# Patient Record
Sex: Female | Born: 1955 | Race: White | Hispanic: No | Marital: Married | Smoking: Never smoker
Health system: Southern US, Community
[De-identification: ages and names within clinical notes are randomized; demographics above are authoritative.]

## PROBLEM LIST (undated history)

## (undated) DIAGNOSIS — H8109 Meniere's disease, unspecified ear: Secondary | ICD-10-CM

## (undated) DIAGNOSIS — I10 Essential (primary) hypertension: Secondary | ICD-10-CM

## (undated) DIAGNOSIS — F419 Anxiety disorder, unspecified: Secondary | ICD-10-CM

## (undated) HISTORY — PX: OTHER SURGICAL HISTORY: SHX169

## (undated) HISTORY — PX: CHOLECYSTECTOMY: SHX55

---

## 1998-09-25 ENCOUNTER — Other Ambulatory Visit: Admission: RE | Admit: 1998-09-25 | Discharge: 1998-09-25 | Payer: Self-pay | Admitting: Gynecology

## 1999-11-27 ENCOUNTER — Other Ambulatory Visit: Admission: RE | Admit: 1999-11-27 | Discharge: 1999-11-27 | Payer: Self-pay | Admitting: Gynecology

## 2000-11-11 ENCOUNTER — Encounter: Payer: Self-pay | Admitting: Family Medicine

## 2000-11-11 ENCOUNTER — Ambulatory Visit (HOSPITAL_COMMUNITY): Admission: RE | Admit: 2000-11-11 | Discharge: 2000-11-11 | Payer: Self-pay | Admitting: Family Medicine

## 2004-01-24 ENCOUNTER — Ambulatory Visit (HOSPITAL_COMMUNITY): Admission: RE | Admit: 2004-01-24 | Discharge: 2004-01-24 | Payer: Self-pay | Admitting: Family Medicine

## 2004-10-30 ENCOUNTER — Ambulatory Visit (HOSPITAL_COMMUNITY): Admission: RE | Admit: 2004-10-30 | Discharge: 2004-10-30 | Payer: Self-pay | Admitting: Family Medicine

## 2005-01-15 ENCOUNTER — Ambulatory Visit: Payer: Self-pay | Admitting: Internal Medicine

## 2005-01-23 ENCOUNTER — Ambulatory Visit: Payer: Self-pay | Admitting: Internal Medicine

## 2006-01-29 ENCOUNTER — Ambulatory Visit (HOSPITAL_COMMUNITY): Admission: RE | Admit: 2006-01-29 | Discharge: 2006-01-29 | Payer: Self-pay | Admitting: Family Medicine

## 2006-02-06 ENCOUNTER — Ambulatory Visit (HOSPITAL_COMMUNITY): Admission: RE | Admit: 2006-02-06 | Discharge: 2006-02-06 | Payer: Self-pay | Admitting: Family Medicine

## 2006-02-27 ENCOUNTER — Ambulatory Visit: Payer: Self-pay | Admitting: Internal Medicine

## 2006-03-10 ENCOUNTER — Ambulatory Visit: Payer: Self-pay | Admitting: Internal Medicine

## 2006-03-10 ENCOUNTER — Ambulatory Visit (HOSPITAL_COMMUNITY): Admission: RE | Admit: 2006-03-10 | Discharge: 2006-03-10 | Payer: Self-pay | Admitting: Internal Medicine

## 2006-03-10 ENCOUNTER — Encounter (INDEPENDENT_AMBULATORY_CARE_PROVIDER_SITE_OTHER): Payer: Self-pay | Admitting: *Deleted

## 2009-04-04 ENCOUNTER — Ambulatory Visit (HOSPITAL_COMMUNITY): Admission: RE | Admit: 2009-04-04 | Discharge: 2009-04-04 | Payer: Self-pay | Admitting: Family Medicine

## 2009-08-03 ENCOUNTER — Ambulatory Visit (HOSPITAL_COMMUNITY): Admission: RE | Admit: 2009-08-03 | Discharge: 2009-08-03 | Payer: Self-pay | Admitting: Family Medicine

## 2009-08-12 ENCOUNTER — Encounter: Admission: RE | Admit: 2009-08-12 | Discharge: 2009-08-12 | Payer: Self-pay | Admitting: Orthopedic Surgery

## 2009-08-15 ENCOUNTER — Encounter: Admission: RE | Admit: 2009-08-15 | Discharge: 2009-08-15 | Payer: Self-pay | Admitting: Orthopedic Surgery

## 2009-09-18 ENCOUNTER — Encounter: Admission: RE | Admit: 2009-09-18 | Discharge: 2009-09-18 | Payer: Self-pay | Admitting: Orthopedic Surgery

## 2010-03-18 ENCOUNTER — Encounter: Payer: Self-pay | Admitting: Family Medicine

## 2010-03-18 ENCOUNTER — Encounter: Payer: Self-pay | Admitting: Orthopedic Surgery

## 2010-05-28 ENCOUNTER — Other Ambulatory Visit (HOSPITAL_COMMUNITY): Payer: Self-pay | Admitting: Family Medicine

## 2010-05-28 DIAGNOSIS — Z01419 Encounter for gynecological examination (general) (routine) without abnormal findings: Secondary | ICD-10-CM

## 2010-05-28 DIAGNOSIS — I4510 Unspecified right bundle-branch block: Secondary | ICD-10-CM

## 2010-05-28 DIAGNOSIS — D259 Leiomyoma of uterus, unspecified: Secondary | ICD-10-CM

## 2010-05-28 DIAGNOSIS — Z780 Asymptomatic menopausal state: Secondary | ICD-10-CM

## 2010-05-31 ENCOUNTER — Ambulatory Visit (HOSPITAL_COMMUNITY)
Admission: RE | Admit: 2010-05-31 | Discharge: 2010-05-31 | Disposition: A | Discharge status: Home / Self Care | Payer: PRIVATE HEALTH INSURANCE | Source: Ambulatory Visit | Attending: Family Medicine | Admitting: Family Medicine

## 2010-05-31 ENCOUNTER — Encounter (HOSPITAL_COMMUNITY): Payer: Self-pay

## 2010-05-31 DIAGNOSIS — Z780 Asymptomatic menopausal state: Secondary | ICD-10-CM

## 2010-05-31 DIAGNOSIS — Z01419 Encounter for gynecological examination (general) (routine) without abnormal findings: Secondary | ICD-10-CM

## 2010-05-31 DIAGNOSIS — I4510 Unspecified right bundle-branch block: Secondary | ICD-10-CM

## 2010-05-31 DIAGNOSIS — M818 Other osteoporosis without current pathological fracture: Secondary | ICD-10-CM | POA: Insufficient documentation

## 2010-05-31 DIAGNOSIS — D259 Leiomyoma of uterus, unspecified: Secondary | ICD-10-CM

## 2010-07-13 NOTE — Op Note (Signed)
[name], [name]                 ACCOUNT NO.:  [id number]   MEDICAL RECORD NO.:  [id number]          PATIENT TYPE:  AMB   LOCATION:  DAY                           FACILITY:  [facility]   PHYSICIAN:  Virginia Jones, M.D.    DATE OF BIRTH:  1955/03/28   DATE OF PROCEDURE:  03/10/2006  DATE OF DISCHARGE:                               OPERATIVE REPORT   PROCEDURE:  Colonoscopy with polypectomy.   INDICATIONS:  Virginia Jones is 55 year old Caucasian female who is undergoing  average risk screening colonoscopy.  Procedures were reviewed with the  patient and informed consent was obtained.   MEDS FOR CONSCIOUS SEDATION:  Demerol 50 mg IV, Versed 6 mg IV.   FINDINGS:  Procedure performed in endoscopy suite.  The patient's vital  signs and O2 sat were monitored during procedure and remained stable.  The patient was placed left lateral recumbent position.  Rectal  examination performed.  No abnormality noted external or digital exam.  Pentax videoscope was placed rectum and advanced under vision into  sigmoid colon and beyond.  Preparation was satisfactory.  Scope was  passed into cecum which was identified by ileocecal valve and  appendiceal orifice.  Pictures taken for the record.  There was a 2 x 3  mm polyp at cecum which was easily ablated via cold biopsy.  There was  another small polyp at proximal sigmoid colon which was ablated via cold  biopsy.  At [facility] colon there was a polyp on a short stalk about 8-  9 mm in maximal diameter.  This was snared and possibly broken pieces as  was removed.  There was a little residual polyp at polypectomy site  which was easily coagulated using snare tip.  Polypectomy was felt to be  complete.  Finally there was another small polyp at distal sigmoid  colon.  This was about 4 mm and this was easily ablated using snare tip.  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  Rectal mucosa was normal.   Scope  was retroflexed to examine anorectal junction and there was single small  anal papilla.  Endoscope was straightened and withdrawn.  The patient  tolerated the procedure well.   FINAL DIAGNOSIS:  8-9 mm polyp snared from mid sigmoid colon.  Two  polyps ablated via cold biopsy.  One from cecum, another one from  proximal sigmoid colon.  Fourth small polyp at distal sigmoid colon was  coagulated using snare tip.  Small anal papilla.   RECOMMENDATIONS:  Standard instructions given.  I will be contacting  patient results of biopsy and further recommendations regarding timing  of her next colonoscopy.      Virginia Jones, M.D.  Electronically Signed     NR/MEDQ  D:  03/10/2006  T:  03/11/2006  Job:  [id number]   cc:   Corrie Mckusick, M.D.  Fax: [phone]

## 2010-07-13 NOTE — Consult Note (Signed)
NAMEMARINA, BOERNER                 ACCOUNT NO.:  [id number]   MEDICAL RECORD NO.:  [id number]          PATIENT TYPE:  AMB   LOCATION:                                FACILITY:  [facility]   PHYSICIAN:  Lionel December, M.D.    DATE OF BIRTH:  24-Sep-1955   DATE OF CONSULTATION:  02/27/2006  DATE OF DISCHARGE:                                 CONSULTATION   REASON FOR CONSULTATION:  The patient interested in colonoscopy.   Still with intermittent chest pain.   HISTORY OF PRESENT ILLNESS:  Virginia Jones is a 55 year old Caucasian female who  is referred through courtesy of Dr. Phillips Odor for colonoscopy.  The  patient denies melena, rectal bleeding.  Her bowels have always been  irregular.  She may go daily, every other day or every third day.  She  denies abdominal pain, heartburn or dysphagia.  She remains with  intermittent retrosternal pain.  She was last seen by me in November  2006 for this pain.  It was felt to be a musculoskeletal pain.  At that  time, she was treated empirically with AcipHex without improvement.  She  also had a negative Cardiolite scan.  She also did not improve with  NSAIDs.  She states this pain has gradually improved.  Now it is  intermittent and less intense and mostly experienced when she changes  position or turns in bed.  She wonders if she should also have an EGD at  the time of colonoscopy.  She has very good appetite.  She has gained 18  pounds in the last 14 months.  She states she is trying to walk and  exercise daily, but apparently not doing enough.   She is on Zoloft 25 mg every other day, Protonix 40 mg q.a.m., Advil 4  mg daily p.r.n., HCTZ 25 mg daily.   PAST MEDICAL HISTORY:  1. History of depression diagnosed in 1996 secondary to loss of mother      controlled with therapy.  2. Meniere's disease.  3. She had shunting to her left ear in July 2006 with good response.  4. She had cholecystectomy 1990 for gallstones.  5. She is on PPI for presumed GERD,  although she has not had typical      symptoms.   ALLERGIES:  NK.   FAMILY HISTORY:  Mother had emphysema and died at age 51.  Father is 60  years old in good health.  She has a sister and two brothers also in  good health.   SOCIAL HISTORY:  She is married but does not have any children.  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  She has been working in  [employer] for over 7 years.  She has never smoked cigarettes and  drinks alcohol socially but not every day.   OBJECTIVE:  Pleasant, well-developed, well-nourished Caucasian female  who is in no acute distress.  She is 160 pounds.  She is 5 feet 3 inches  tall.  Pulse 62 per minute, blood pressure 122/86, temperature is 97.7.  Conjunctivae  is pink.  Sclerae is nonicteric.  Oropharyngeal mucosa is  normal.  No neck masses or thyromegaly noted.  Cardiac exam with regular  rhythm.  Normal S1-S2.  No murmur or gallop noted.  Lungs are clear to  auscultation.  Abdomen is full.  Bowel sounds are normal.  On palpation  it is soft and nontender.  No organomegaly or masses noted.  Rectal  examination deferred.  No peripheral edema or clubbing noted.   ASSESSMENT:  1. Virginia Jones is a 55 year old Caucasian female with average risk for      colorectal carcinoma.  I agree with the idea of screening      colonoscopy.  2. History of chest pain felt to be atypical:  I am not sure that      proton pump inhibitor has helped.  She does not have typical      symptoms of gastroesophageal reflux disease.  At this point, I am      not sure whether an EGD would help.  Would stop proton pump      inhibitor and see if she has new onset of symptoms in which case      EGD would be offered   RECOMMENDATIONS:  Screening colonoscopy to be performed at [facility] in near  future.  I have reviewed the procedures with the patient including  conscious sedation.  She is agreeable.   I would like for her to stop Protonix and see if she has a relapse of  her  symptoms in which case would consider diagnostic EGD.   We appreciate the opportunity to participate in care of this nice lady.   ADDENDUM:  Labs reviewed done January 29, 2006:  CBC:  Comprehensive  chemistry panel normal.  Total cholesterol was 166, AST of 95,  triglyceride 58 and her LDL was 59.      Lionel December, M.D.  Electronically Signed     NR/MEDQ  D:  02/27/2006  T:  02/27/2006  Job:  [id number]

## 2011-02-28 ENCOUNTER — Encounter (INDEPENDENT_AMBULATORY_CARE_PROVIDER_SITE_OTHER): Payer: Self-pay | Admitting: *Deleted

## 2011-08-06 ENCOUNTER — Telehealth (INDEPENDENT_AMBULATORY_CARE_PROVIDER_SITE_OTHER): Payer: Self-pay | Admitting: *Deleted

## 2011-08-06 ENCOUNTER — Other Ambulatory Visit (INDEPENDENT_AMBULATORY_CARE_PROVIDER_SITE_OTHER): Payer: Self-pay | Admitting: *Deleted

## 2011-08-06 DIAGNOSIS — Z1211 Encounter for screening for malignant neoplasm of colon: Secondary | ICD-10-CM

## 2011-08-06 DIAGNOSIS — Z86010 Personal history of colonic polyps: Secondary | ICD-10-CM

## 2011-08-06 MED ORDER — PEG-KCL-NACL-NASULF-NA ASC-C 100 G PO SOLR: 1.0000 | Freq: Once | ORAL | Status: DC

## 2011-08-06 NOTE — Telephone Encounter (Signed)
Patient needs movi prep 

## 2011-09-16 ENCOUNTER — Encounter (HOSPITAL_COMMUNITY): Payer: Self-pay | Admitting: Pharmacy Technician

## 2011-09-16 ENCOUNTER — Telehealth (INDEPENDENT_AMBULATORY_CARE_PROVIDER_SITE_OTHER): Payer: Self-pay | Admitting: *Deleted

## 2011-09-16 NOTE — Telephone Encounter (Signed)
agree

## 2011-09-16 NOTE — Telephone Encounter (Signed)
PCP/Requesting MD: golding  Name & DOB: darrell leonhardt 01/14/1956     Procedure: tcs  Reason/Indication:  Hx polyps  Has patient had this procedure before?  yes  If so, when, by whom and where?  1/08  Is there a family history of colon cancer?  no  Who?  What age when diagnosed?    Is patient diabetic?   no      Does patient have prosthetic heart valve?  no  Do you have a pacemaker?  no  Has patient had joint replacement within last 12 months?  no  Is patient on Coumadin, Plavix and/or Aspirin? no  Medications: hctz 25 mg daily, zoloft 100 mg daily  Allergies: nkda  Medication Adjustment:   Procedure date & time: 09/26/11 at 1030

## 2011-09-23 ENCOUNTER — Telehealth (INDEPENDENT_AMBULATORY_CARE_PROVIDER_SITE_OTHER): Payer: Self-pay | Admitting: *Deleted

## 2011-09-23 DIAGNOSIS — Z1211 Encounter for screening for malignant neoplasm of colon: Secondary | ICD-10-CM

## 2011-09-23 MED ORDER — PEG-KCL-NACL-NASULF-NA ASC-C 100 G PO SOLR: 1.0000 | Freq: Once | ORAL | Status: DC

## 2011-09-23 NOTE — Telephone Encounter (Signed)
Patient states pharmacy didn't have, please resend 

## 2011-09-26 ENCOUNTER — Encounter (HOSPITAL_COMMUNITY)
Admission: RE | Disposition: A | Discharge status: Home / Self Care | Payer: Self-pay | Source: Ambulatory Visit | Attending: Internal Medicine

## 2011-09-26 ENCOUNTER — Encounter (HOSPITAL_COMMUNITY): Payer: Self-pay | Admitting: *Deleted

## 2011-09-26 ENCOUNTER — Ambulatory Visit (HOSPITAL_COMMUNITY)
Admission: RE | Admit: 2011-09-26 | Discharge: 2011-09-26 | Disposition: A | Discharge status: Home / Self Care | Payer: PRIVATE HEALTH INSURANCE | Source: Ambulatory Visit | Attending: Internal Medicine | Admitting: Internal Medicine

## 2011-09-26 DIAGNOSIS — D126 Benign neoplasm of colon, unspecified: Secondary | ICD-10-CM | POA: Insufficient documentation

## 2011-09-26 DIAGNOSIS — Z860100 Personal history of colon polyps, unspecified: Secondary | ICD-10-CM | POA: Insufficient documentation

## 2011-09-26 DIAGNOSIS — Z09 Encounter for follow-up examination after completed treatment for conditions other than malignant neoplasm: Secondary | ICD-10-CM | POA: Insufficient documentation

## 2011-09-26 DIAGNOSIS — K6389 Other specified diseases of intestine: Secondary | ICD-10-CM

## 2011-09-26 DIAGNOSIS — K644 Residual hemorrhoidal skin tags: Secondary | ICD-10-CM

## 2011-09-26 HISTORY — DX: Anxiety disorder, unspecified: F41.9

## 2011-09-26 HISTORY — PX: COLONOSCOPY: SHX5424

## 2011-09-26 SURGERY — COLONOSCOPY
Anesthesia: Moderate Sedation

## 2011-09-26 MED ORDER — MEPERIDINE HCL 50 MG/ML IJ SOLN
INTRAMUSCULAR | Status: AC
  Filled 2011-09-26: qty 1

## 2011-09-26 MED ORDER — SODIUM CHLORIDE 0.45 % IV SOLN
Freq: Once | INTRAVENOUS | Status: AC
  Administered 2011-09-26: 10:00:00 via INTRAVENOUS

## 2011-09-26 MED ORDER — MIDAZOLAM HCL 5 MG/5ML IJ SOLN
INTRAMUSCULAR | Status: DC | PRN
  Administered 2011-09-26 (×4): 2 mg via INTRAVENOUS

## 2011-09-26 MED ORDER — STERILE WATER FOR IRRIGATION IR SOLN
Status: DC | PRN
  Administered 2011-09-26: 11:00:00

## 2011-09-26 MED ORDER — MEPERIDINE HCL 50 MG/ML IJ SOLN
INTRAMUSCULAR | Status: DC | PRN
  Administered 2011-09-26 (×2): 25 mg via INTRAVENOUS

## 2011-09-26 MED ORDER — MIDAZOLAM HCL 5 MG/5ML IJ SOLN
INTRAMUSCULAR | Status: AC
  Filled 2011-09-26: qty 10

## 2011-09-26 NOTE — H&P (Signed)
Virginia Jones is an 56 y.o. female.   Chief Complaint: Patient is here for colonoscopy. HPI: Patient is 55 year old Caucasian female with history of colonic polyps. She had 3 polyps removed over 5 years ago and these were tubular adenomas. Fourth polyp was coagulated. She denies abdominal pain change in her bowel habits or rectal bleeding. Family history significant for colonic polyps in her father no history of colorectal carcinoma.  Past Medical History  Diagnosis Date  . Anxiety     Past Surgical History  Procedure Date  . Cholecystectomy   . Ear shunt left     History reviewed. No pertinent family history. Social History:  reports that she has never smoked. She does not have any smokeless tobacco history on file. She reports that she drinks alcohol. She reports that she does not use illicit drugs.  Allergies: No Known Allergies  Medications Prior to Admission  Medication Sig Dispense Refill  . Calcium Carbonate-Vit D-Min (CALCIUM-VITAMIN D-MINERALS) 600-400 MG-UNIT CHEW Chew 1 tablet by mouth every morning.      . hydrochlorothiazide (HYDRODIURIL) 25 MG tablet Take 25 mg by mouth every morning.      . peg 3350 powder (MOVIPREP) 100 G SOLR Take 1 kit (100 g total) by mouth once.  1 kit  0  . sertraline (ZOLOFT) 100 MG tablet Take 100 mg by mouth every morning.        No results found for this or any previous visit (from the past 48 hour(s)). No results found.  ROS  Blood pressure 133/84, pulse 68, temperature 98.2 F (36.8 C), temperature source Oral, resp. rate 16, height 5\' 3"  (1.6 m), weight 182 lb (82.555 kg), SpO2 94.00%. Physical Exam  Constitutional: She appears well-developed and well-nourished.  HENT:  Mouth/Throat: Oropharynx is clear and moist.  Eyes: Conjunctivae are normal. No scleral icterus.  Neck: No thyromegaly present.  Cardiovascular: Normal rate, regular rhythm and normal heart sounds.   No murmur heard. Respiratory: Effort normal.  GI: Soft. She  exhibits no distension and no mass. There is no tenderness.  Musculoskeletal: She exhibits no edema.  Lymphadenopathy:    She has no cervical adenopathy.  Neurological: She is alert.  Skin: Skin is warm and dry.     Assessment/Plan History of colonic polyps. Surveillance colonoscopy.  Alhaji Mcneal U 09/26/2011, 10:43 AM

## 2011-09-26 NOTE — Op Note (Signed)
COLONOSCOPY PROCEDURE REPORT  PATIENT:  Virginia Jones  MR#:  [id number] Birthdate:  12-11-1955, 56 y.o., female Endoscopist:  Dr. Malissa Hippo, MD Referred By:  Dr. Colette Ribas, MD Procedure Date: 09/26/2011  Procedure:   Colonoscopy with snare polypectomy.  Indications:  Patient is 56 year old Caucasian female with history of colonic adenomas and here for surveillance colonoscopy. Her last exam was in January 2008 with removal of 3 adenomas. Fourth polyp was coagulated.  Informed Consent:  The procedure and risks were reviewed with the patient and informed consent was obtained.  Medications:  Demerol 50 mg IV Versed 8 mg IV  Description of procedure:  After a digital rectal exam was performed, that colonoscope was advanced from the anus through the rectum and colon to the area of the cecum, ileocecal valve and appendiceal orifice. The cecum was deeply intubated. These structures were well-seen and photographed for the record. From the level of the cecum and ileocecal valve, the scope was slowly and cautiously withdrawn. The mucosal surfaces were carefully surveyed utilizing scope tip to flexion to facilitate fold flattening as needed. The scope was pulled down into the rectum where a thorough exam including retroflexion was performed.  Findings:   Prep satisfactory. Mucosa of the cecum had to be washed free of stool to see landmarks. Small polyp ablated via cold biopsy from ascending colon. Broad-based 8-10 mm polyp snared from proximal sigmoid colon. Part of the polyp was coagulated during this process. Normal rectal mucosa. Small hemorrhoids below the dentate line and 2 anal papillae.  Therapeutic/Diagnostic Maneuvers Performed:  See above  Complications:  None  Cecal Withdrawal Time:  21 minutes  Impression:  Examination performed to cecum. Small polyp ablated via cold biopsy from ascending colon. 8-10 mm broad based polyp snared from proximal sigmoid colon. Small  external hemorrhoids and two anal papillae  Recommendations:  Standard instructions given. I will contact patient with biopsy results and further recommendations.  REHMAN,NAJEEB U  09/26/2011 11:26 AM  CC: Dr. Colette Ribas, MD & Dr. Bonnetta Barry ref. provider found

## 2011-10-01 ENCOUNTER — Encounter (HOSPITAL_COMMUNITY): Payer: Self-pay | Admitting: Internal Medicine

## 2011-10-02 ENCOUNTER — Encounter (INDEPENDENT_AMBULATORY_CARE_PROVIDER_SITE_OTHER): Payer: Self-pay | Admitting: *Deleted

## 2016-03-26 ENCOUNTER — Encounter: Payer: Self-pay | Admitting: Internal Medicine

## 2016-09-23 ENCOUNTER — Encounter (INDEPENDENT_AMBULATORY_CARE_PROVIDER_SITE_OTHER): Payer: Self-pay | Admitting: *Deleted

## 2017-06-17 DIAGNOSIS — Z1389 Encounter for screening for other disorder: Secondary | ICD-10-CM | POA: Diagnosis not present

## 2017-06-17 DIAGNOSIS — M5430 Sciatica, unspecified side: Secondary | ICD-10-CM | POA: Diagnosis not present

## 2017-06-17 DIAGNOSIS — S300XXA Contusion of lower back and pelvis, initial encounter: Secondary | ICD-10-CM | POA: Diagnosis not present

## 2017-08-08 DIAGNOSIS — I10 Essential (primary) hypertension: Secondary | ICD-10-CM | POA: Diagnosis not present

## 2017-08-08 DIAGNOSIS — R739 Hyperglycemia, unspecified: Secondary | ICD-10-CM | POA: Diagnosis not present

## 2017-08-08 DIAGNOSIS — Z1389 Encounter for screening for other disorder: Secondary | ICD-10-CM | POA: Diagnosis not present

## 2017-08-08 DIAGNOSIS — Z23 Encounter for immunization: Secondary | ICD-10-CM | POA: Diagnosis not present

## 2017-08-08 DIAGNOSIS — R001 Bradycardia, unspecified: Secondary | ICD-10-CM | POA: Diagnosis not present

## 2017-08-08 DIAGNOSIS — E6609 Other obesity due to excess calories: Secondary | ICD-10-CM | POA: Diagnosis not present

## 2017-08-08 DIAGNOSIS — M5431 Sciatica, right side: Secondary | ICD-10-CM | POA: Diagnosis not present

## 2017-08-08 DIAGNOSIS — Z0001 Encounter for general adult medical examination with abnormal findings: Secondary | ICD-10-CM | POA: Diagnosis not present

## 2017-12-15 DIAGNOSIS — L570 Actinic keratosis: Secondary | ICD-10-CM | POA: Diagnosis not present

## 2017-12-15 DIAGNOSIS — L821 Other seborrheic keratosis: Secondary | ICD-10-CM | POA: Diagnosis not present

## 2017-12-15 DIAGNOSIS — L814 Other melanin hyperpigmentation: Secondary | ICD-10-CM | POA: Diagnosis not present

## 2018-03-11 DIAGNOSIS — Z1231 Encounter for screening mammogram for malignant neoplasm of breast: Secondary | ICD-10-CM | POA: Diagnosis not present

## 2018-03-16 DIAGNOSIS — E6609 Other obesity due to excess calories: Secondary | ICD-10-CM | POA: Diagnosis not present

## 2018-03-16 DIAGNOSIS — Z6833 Body mass index (BMI) 33.0-33.9, adult: Secondary | ICD-10-CM | POA: Diagnosis not present

## 2018-03-16 DIAGNOSIS — Z1389 Encounter for screening for other disorder: Secondary | ICD-10-CM | POA: Diagnosis not present

## 2018-04-30 DIAGNOSIS — Z23 Encounter for immunization: Secondary | ICD-10-CM | POA: Diagnosis not present

## 2018-04-30 DIAGNOSIS — B079 Viral wart, unspecified: Secondary | ICD-10-CM | POA: Diagnosis not present

## 2018-04-30 DIAGNOSIS — D485 Neoplasm of uncertain behavior of skin: Secondary | ICD-10-CM | POA: Diagnosis not present

## 2018-08-26 ENCOUNTER — Encounter (HOSPITAL_COMMUNITY): Payer: Self-pay | Admitting: Emergency Medicine

## 2018-08-26 ENCOUNTER — Emergency Department (HOSPITAL_COMMUNITY): Payer: No Typology Code available for payment source

## 2018-08-26 ENCOUNTER — Emergency Department (HOSPITAL_COMMUNITY)
Admission: EM | Admit: 2018-08-26 | Discharge: 2018-08-26 | Disposition: A | Discharge status: Home / Self Care | Payer: No Typology Code available for payment source | Attending: Emergency Medicine | Admitting: Emergency Medicine

## 2018-08-26 DIAGNOSIS — Z79899 Other long term (current) drug therapy: Secondary | ICD-10-CM | POA: Insufficient documentation

## 2018-08-26 DIAGNOSIS — S0003XA Contusion of scalp, initial encounter: Secondary | ICD-10-CM | POA: Insufficient documentation

## 2018-08-26 DIAGNOSIS — Y939 Activity, unspecified: Secondary | ICD-10-CM | POA: Diagnosis not present

## 2018-08-26 DIAGNOSIS — Y929 Unspecified place or not applicable: Secondary | ICD-10-CM | POA: Insufficient documentation

## 2018-08-26 DIAGNOSIS — R51 Headache: Secondary | ICD-10-CM | POA: Diagnosis present

## 2018-08-26 DIAGNOSIS — Y999 Unspecified external cause status: Secondary | ICD-10-CM | POA: Insufficient documentation

## 2018-08-26 MED ORDER — IBUPROFEN 600 MG PO TABS: 600.0000 mg | ORAL_TABLET | Freq: Four times a day (QID) | ORAL | 0 refills | Status: DC | PRN

## 2018-08-26 MED ORDER — LORAZEPAM 1 MG PO TABS: 1.0000 mg | ORAL_TABLET | Freq: Once | ORAL | Status: DC

## 2018-08-26 MED ORDER — CYCLOBENZAPRINE HCL 10 MG PO TABS: 10.0000 mg | ORAL_TABLET | Freq: Two times a day (BID) | ORAL | 0 refills | Status: DC | PRN

## 2018-08-26 NOTE — ED Notes (Signed)
Patient verbalizes understanding of discharge instructions. Opportunity for questioning and answers were provided. Armband removed by staff, pt discharged from ED.  
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
## 2019-05-03 ENCOUNTER — Ambulatory Visit: Payer: PRIVATE HEALTH INSURANCE | Attending: Internal Medicine

## 2019-05-03 DIAGNOSIS — Z23 Encounter for immunization: Secondary | ICD-10-CM | POA: Insufficient documentation

## 2019-05-03 NOTE — Progress Notes (Signed)
   Covid-19 Vaccination Clinic  Name:  Virginia Jones    MRN: [id number] DOB: Oct 14, 1955  05/03/2019  [name] was observed post Covid-19 immunization for 15 minutes without incident. She was provided with Vaccine Information Sheet and instruction to access the V-Safe system.   [name] was instructed to call 911 with any severe reactions post vaccine: Marland Kitchen Difficulty breathing  . Swelling of face and throat  . A fast heartbeat  . A bad rash all over body  . Dizziness and weakness   Immunizations Administered    Name Date Dose VIS Date Route   Pfizer COVID-19 Vaccine 05/03/2019 12:42 PM 0.3 mL 02/05/2019 Intramuscular   Manufacturer: [place]   Lot: [id number]   [place]: [id number]

## 2019-06-02 ENCOUNTER — Ambulatory Visit: Payer: Commercial Managed Care - PPO | Attending: Internal Medicine

## 2019-06-02 DIAGNOSIS — Z23 Encounter for immunization: Secondary | ICD-10-CM

## 2019-06-02 NOTE — Progress Notes (Signed)
   Covid-19 Vaccination Clinic  Name:  Virginia Jones    MRN: [id number] DOB: [birth year]  06/02/2019  [name] was observed post Covid-19 immunization for 15 minutes without incident. She was provided with Vaccine Information Sheet and instruction to access the V-Safe system.   [name] was instructed to call 911 with any severe reactions post vaccine: Marland Kitchen Difficulty breathing  . Swelling of face and throat  . A fast heartbeat  . A bad rash all over body  . Dizziness and weakness   Immunizations Administered    Name Date Dose VIS Date Route   Pfizer COVID-19 Vaccine 06/02/2019 11:58 AM 0.3 mL 02/05/2019 Intramuscular   Manufacturer: [place]   Lot: [id number]   [place]: [id number]

## 2020-03-22 DIAGNOSIS — Z1231 Encounter for screening mammogram for malignant neoplasm of breast: Secondary | ICD-10-CM | POA: Diagnosis not present

## 2020-08-15 DIAGNOSIS — E6609 Other obesity due to excess calories: Secondary | ICD-10-CM | POA: Diagnosis not present

## 2020-08-15 DIAGNOSIS — Z1389 Encounter for screening for other disorder: Secondary | ICD-10-CM | POA: Diagnosis not present

## 2020-08-15 DIAGNOSIS — I4510 Unspecified right bundle-branch block: Secondary | ICD-10-CM | POA: Diagnosis not present

## 2020-08-15 DIAGNOSIS — E039 Hypothyroidism, unspecified: Secondary | ICD-10-CM | POA: Diagnosis not present

## 2020-08-15 DIAGNOSIS — Z6835 Body mass index (BMI) 35.0-35.9, adult: Secondary | ICD-10-CM | POA: Diagnosis not present

## 2020-08-15 DIAGNOSIS — R69 Illness, unspecified: Secondary | ICD-10-CM | POA: Diagnosis not present

## 2020-08-15 DIAGNOSIS — Z0001 Encounter for general adult medical examination with abnormal findings: Secondary | ICD-10-CM | POA: Diagnosis not present

## 2020-08-15 DIAGNOSIS — Z1331 Encounter for screening for depression: Secondary | ICD-10-CM | POA: Diagnosis not present

## 2020-12-04 DIAGNOSIS — M1990 Unspecified osteoarthritis, unspecified site: Secondary | ICD-10-CM | POA: Diagnosis not present

## 2020-12-04 DIAGNOSIS — M79672 Pain in left foot: Secondary | ICD-10-CM | POA: Diagnosis not present

## 2020-12-25 DIAGNOSIS — M1990 Unspecified osteoarthritis, unspecified site: Secondary | ICD-10-CM | POA: Diagnosis not present

## 2020-12-25 DIAGNOSIS — M79672 Pain in left foot: Secondary | ICD-10-CM | POA: Diagnosis not present

## 2020-12-27 DIAGNOSIS — D2271 Melanocytic nevi of right lower limb, including hip: Secondary | ICD-10-CM | POA: Diagnosis not present

## 2020-12-27 DIAGNOSIS — L821 Other seborrheic keratosis: Secondary | ICD-10-CM | POA: Diagnosis not present

## 2020-12-27 DIAGNOSIS — D225 Melanocytic nevi of trunk: Secondary | ICD-10-CM | POA: Diagnosis not present

## 2020-12-27 DIAGNOSIS — L814 Other melanin hyperpigmentation: Secondary | ICD-10-CM | POA: Diagnosis not present

## 2020-12-27 DIAGNOSIS — L918 Other hypertrophic disorders of the skin: Secondary | ICD-10-CM | POA: Diagnosis not present

## 2020-12-27 DIAGNOSIS — L578 Other skin changes due to chronic exposure to nonionizing radiation: Secondary | ICD-10-CM | POA: Diagnosis not present

## 2021-01-16 DIAGNOSIS — M722 Plantar fascial fibromatosis: Secondary | ICD-10-CM | POA: Diagnosis not present

## 2021-01-16 DIAGNOSIS — M79672 Pain in left foot: Secondary | ICD-10-CM | POA: Diagnosis not present

## 2021-01-16 DIAGNOSIS — M1990 Unspecified osteoarthritis, unspecified site: Secondary | ICD-10-CM | POA: Diagnosis not present

## 2021-01-16 DIAGNOSIS — M79671 Pain in right foot: Secondary | ICD-10-CM | POA: Diagnosis not present

## 2021-02-12 DIAGNOSIS — M1990 Unspecified osteoarthritis, unspecified site: Secondary | ICD-10-CM | POA: Diagnosis not present

## 2021-02-12 DIAGNOSIS — M722 Plantar fascial fibromatosis: Secondary | ICD-10-CM | POA: Diagnosis not present

## 2021-02-12 DIAGNOSIS — M79671 Pain in right foot: Secondary | ICD-10-CM | POA: Diagnosis not present

## 2021-02-12 DIAGNOSIS — M79672 Pain in left foot: Secondary | ICD-10-CM | POA: Diagnosis not present

## 2021-02-15 DIAGNOSIS — U071 COVID-19: Secondary | ICD-10-CM | POA: Diagnosis not present

## 2021-04-06 DIAGNOSIS — Z1231 Encounter for screening mammogram for malignant neoplasm of breast: Secondary | ICD-10-CM | POA: Diagnosis not present

## 2021-06-04 ENCOUNTER — Encounter (HOSPITAL_COMMUNITY): Payer: Self-pay | Admitting: Emergency Medicine

## 2021-06-04 ENCOUNTER — Emergency Department (HOSPITAL_COMMUNITY)
Admission: EM | Admit: 2021-06-04 | Discharge: 2021-06-04 | Disposition: A | Discharge status: Home / Self Care | Payer: Medicare HMO | Attending: Emergency Medicine | Admitting: Emergency Medicine

## 2021-06-04 ENCOUNTER — Emergency Department (HOSPITAL_COMMUNITY): Payer: Medicare HMO

## 2021-06-04 DIAGNOSIS — S59202A Unspecified physeal fracture of lower end of radius, left arm, initial encounter for closed fracture: Secondary | ICD-10-CM | POA: Diagnosis not present

## 2021-06-04 DIAGNOSIS — S52502A Unspecified fracture of the lower end of left radius, initial encounter for closed fracture: Secondary | ICD-10-CM

## 2021-06-04 DIAGNOSIS — Y9301 Activity, walking, marching and hiking: Secondary | ICD-10-CM | POA: Diagnosis not present

## 2021-06-04 DIAGNOSIS — W010XXA Fall on same level from slipping, tripping and stumbling without subsequent striking against object, initial encounter: Secondary | ICD-10-CM | POA: Diagnosis not present

## 2021-06-04 DIAGNOSIS — S6992XA Unspecified injury of left wrist, hand and finger(s), initial encounter: Secondary | ICD-10-CM | POA: Diagnosis present

## 2021-06-04 MED ORDER — HYDROMORPHONE HCL 1 MG/ML IJ SOLN
1.0000 mg | Freq: Once | INTRAMUSCULAR | Status: AC
  Administered 2021-06-04: 1 mg via INTRAVENOUS
  Filled 2021-06-04: qty 1

## 2021-06-04 MED ORDER — PROPOFOL 10 MG/ML IV BOLUS
2.0000 mg/kg | Freq: Once | INTRAVENOUS | Status: DC
  Filled 2021-06-04: qty 20

## 2021-06-04 MED ORDER — PROPOFOL 10 MG/ML IV BOLUS
INTRAVENOUS | Status: AC | PRN
  Administered 2021-06-04: 40 mg via INTRAVENOUS
  Administered 2021-06-04: 20 mg via INTRAVENOUS

## 2021-06-04 MED ORDER — ONDANSETRON HCL 4 MG/2ML IJ SOLN
4.0000 mg | Freq: Once | INTRAMUSCULAR | Status: AC
  Administered 2021-06-04: 4 mg via INTRAVENOUS
  Filled 2021-06-04: qty 2

## 2021-06-04 MED ORDER — SODIUM CHLORIDE 0.9 % IV BOLUS
1000.0000 mL | Freq: Once | INTRAVENOUS | Status: AC
  Administered 2021-06-04: 1000 mL via INTRAVENOUS

## 2021-06-04 MED ORDER — OXYCODONE-ACETAMINOPHEN 5-325 MG PO TABS
1.0000 | ORAL_TABLET | ORAL | Status: DC | PRN
  Administered 2021-06-04: 1 via ORAL
  Filled 2021-06-04: qty 1

## 2021-06-04 MED ORDER — OXYCODONE-ACETAMINOPHEN 5-325 MG PO TABS: 1.0000 | ORAL_TABLET | Freq: Three times a day (TID) | ORAL | 0 refills | Status: AC | PRN
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
## 2021-06-04 NOTE — ED Notes (Signed)
RT at bedside for procedure.

## 2021-06-04 NOTE — ED Provider Notes (Signed)
.  Sedation ? ?Date/Time: 06/04/2021 11:22 AM ?Performed by: Lorelle Gibbs, DO ?Authorized by: Lorelle Gibbs, DO  ? ?Consent:  ?  Consent obtained:  Written ?  Consent given by:  Patient ?  Risks discussed:  Inadequate sedation, nausea, prolonged hypoxia resulting in organ damage, prolonged sedation necessitating reversal, allergic reaction and vomiting ?  Alternatives discussed:  Analgesia without sedation and regional anesthesia ?Universal protocol:  ?  Immediately prior to procedure, a time out was called: yes   ?Pre-sedation assessment:  ?  Time since last food or drink:  5 ?  ASA classification: class 2 - patient with mild systemic disease   ?  Mallampati score:  II - soft palate, uvula, fauces visible ?  Pre-sedation assessments completed and reviewed: airway patency, cardiovascular function, hydration status, mental status, nausea/vomiting, pain level and respiratory function   ?Immediate pre-procedure details:  ?  Reviewed: vital signs, relevant labs/tests and NPO status   ?  Verified: bag valve mask available, emergency equipment available, IV patency confirmed, oxygen available and suction available   ?Procedure details (see MAR for exact dosages):  ?  Preoxygenation:  Nasal cannula ?  Sedation:  Propofol ?  Intended level of sedation: moderate (conscious sedation) ?  Analgesia:  None ?  Intra-procedure monitoring:  Blood pressure monitoring, continuous capnometry, frequent LOC assessments, cardiac monitor, continuous pulse oximetry and frequent vital sign checks ?  Intra-procedure events: none   ?  Total Provider sedation time (minutes):  20 ?Post-procedure details:  ?  Post-sedation assessments completed and reviewed: airway patency, cardiovascular function, hydration status, mental status, nausea/vomiting, pain level and respiratory function   ?  Patient is stable for discharge or admission: yes   ?  Procedure completion:  Tolerated well, no immediate complications ? ?  ?Lorelle Gibbs,  DO ?06/04/21 1123 ? ?
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
## 2021-06-04 NOTE — ED Provider Notes (Signed)
?[facility] ?Provider Note ? ? ?CSN: [id number] ?Arrival date & time: 06/04/21  7681 ? ?  ? ?History ? ?Chief Complaint  ?Patient presents with  ? Arm Injury  ? Fall  ? ? ?Virginia Jones is a 66 y.o. female. ? ?HPI ? ?Patient without significant medical history presents with complaints of left wrist pain.  Patient states that she was walking on her deck lost her balance and fell directly onto her left wrist.  She denies hitting her head, lose conscious, is not on anticoag.  She endorses that she land directly on her left wrist and has severe pain in that area.  She states pain remains in her wrist and will radiate to her fingers when she tries to move her fingers.  She denies any paresthesia or weakness in her fingers, movement doe will elicit pain.  She denies any head neck back chest pain stomach pain pain in the right upper or lower extremities.  She has had nothing for pain managed at this time. ? ?Husband is at bedside able to validate the story. ? ?Home Medications ?Prior to Admission medications   ?Medication Sig Start Date End Date Taking? Authorizing Provider  ?oxyCODONE-acetaminophen (PERCOCET/ROXICET) 5-325 MG tablet Take 1 tablet by mouth every 8 (eight) hours as needed for up to 5 days for severe pain. 06/04/21 06/09/21 Yes Marcello Fennel, PA-C  ?Calcium Carbonate-Vit D-Min (CALCIUM-VITAMIN D-MINERALS) 600-400 MG-UNIT CHEW Chew 1 tablet by mouth every morning.    [provider]  ?cyclobenzaprine (FLEXERIL) 10 MG tablet Take 1 tablet (10 mg total) by mouth 2 (two) times daily as needed for muscle spasms. 08/26/18   Domenic Moras, PA-C  ?hydrochlorothiazide (HYDRODIURIL) 25 MG tablet Take 25 mg by mouth every morning.    [provider]  ?ibuprofen (ADVIL) 600 MG tablet Take 1 tablet (600 mg total) by mouth every 6 (six) hours as needed. 08/26/18   Domenic Moras, PA-C  ?sertraline (ZOLOFT) 100 MG tablet Take 100 mg by mouth every morning.    [provider]  ?    ? ?Allergies    ?Patient has no known allergies.   ? ?Review of Systems   ?Review of Systems  ?Constitutional:  Negative for chills and fever.  ?Respiratory:  Negative for shortness of breath.   ?Cardiovascular:  Negative for chest pain.  ?Gastrointestinal:  Negative for abdominal pain.  ?Musculoskeletal:   ?     Left wrist pain.  ?Neurological:  Negative for headaches.  ? ?Physical Exam ?Updated Vital Signs ?BP 124/69   Pulse (!) 59   Temp 97.8 ?F (36.6 ?C) (Oral)   Resp 16   Ht '5\' 3"'$  (1.6 m)   Wt 88 kg   SpO2 96%   BMI 34.37 kg/m?  ?Physical Exam ?Vitals and nursing note reviewed.  ?Constitutional:   ?   General: She is not in acute distress. ?   Appearance: She is not ill-appearing.  ?HENT:  ?   Head: Normocephalic and atraumatic.  ?   Comments: No raccoon eyes or battle sign noted. ?   Nose: No congestion.  ?   Mouth/Throat:  ?   Mouth: Mucous membranes are moist.  ?   Pharynx: Oropharynx is clear.  ?   Comments: No oral trauma no trismus or torticollis ?Eyes:  ?   Extraocular Movements: Extraocular movements intact.  ?   Conjunctiva/sclera: Conjunctivae normal.  ?Cardiovascular:  ?   Rate and Rhythm: Normal rate and regular rhythm.  ?  Pulses: Normal pulses.  ?   Heart sounds: No murmur heard. ?  No friction rub. No gallop.  ?Pulmonary:  ?   Effort: No respiratory distress.  ?   Breath sounds: No wheezing, rhonchi or rales.  ?Musculoskeletal:  ?   Comments: Patient has no deformity of the left wrist on the distal aspect of the radius, appears to be angulated dorsally, able to move all fingers, unable to flex or extend at the wrist due to pain.  Neurovascular fully intact.  She is moving the other 3 extremities at difficulty.  ?Skin: ?   General: Skin is warm and dry.  ?Neurological:  ?   Mental Status: She is alert.  ?   Comments: No focal deficits noted on my exam.  ?Psychiatric:     ?   Mood and Affect: Mood normal.  ? ? ?ED Results / Procedures / Treatments   ?Labs ?(all labs ordered are listed, but  only abnormal results are displayed) ?Labs Reviewed - No data to display ? ?EKG ?None ? ?Radiology ?DG Wrist Complete Left ? ?Result Date: 06/04/2021 ?CLINICAL DATA:  66 year old female with distal left radius and ulna fracture status post attempted reduction. EXAM: LEFT WRIST - COMPLETE 3+ VIEW COMPARISON:  0858 hours today. FINDINGS: Splint or cast material now in place. Mildly improved alignment of the comminuted distal left radius fragments. Residual less than 1/2 shaft width radial displacement, and moderate dorsal impaction. Ulnar styloid fracture less apparent due to splint or cast artifact now. Carpal bones appear aligned and intact. No new osseous abnormality identified. IMPRESSION: 1. Comminuted distal left radius fracture with residual lateral displacement and moderate dorsal impaction following splint/cast. 2. Ulnar styloid fracture. Electronically Signed   By: Genevie Ann M.D.   On: 06/04/2021 10:28  ? ?DG Wrist Complete Left ? ?Result Date: 06/04/2021 ?CLINICAL DATA:  Fall. EXAM: LEFT WRIST - COMPLETE 3+ VIEW COMPARISON:  None. FINDINGS: Non comminuted, displaced LEFT distal radius fracture with dorsal angulation of the distal fracture fragment. Question fracture line extension to the articular surface. See key image. Intact radiocarpal alignment Minimally comminuted, nondisplaced LEFT ulnar styloid process fracture. IMPRESSION: 1. LEFT distal radius fracture dislocation, as above, with suspected intra-articular extension 2. Nondisplaced LEFT ulnar styloid process fracture. Electronically Signed   By: Michaelle Birks M.D.   On: 06/04/2021 09:16   ? ?Procedures ?Reduction of fracture ? ?Date/Time: 06/04/2021 11:30 AM ?Performed by: Marcello Fennel, PA-C ?Authorized by: Marcello Fennel, PA-C  ?Consent: Verbal consent obtained. ?Risks and benefits: risks, benefits and alternatives were discussed ?Consent given by: patient ?Required items: required blood products, implants, devices, and special equipment  available ?Patient identity confirmed: verbally with patient and arm band ?Time out: Immediately prior to procedure a "time out" was called to verify the correct patient, procedure, equipment, support staff and site/side marked as required. ?Preparation: Patient was prepped and draped in the usual sterile fashion. ?Local anesthesia used: no ? ?Anesthesia: ?Local anesthesia used: no ? ?Sedation: ?Patient sedated: yes ?Sedation type: moderate (conscious) sedation ?Sedatives: propofol ?Analgesia: hydromorphone ?Vitals: Vital signs were monitored during sedation. ? ?Patient tolerance: patient tolerated the procedure well with no immediate complications ? ?  ? ? ?Medications Ordered in ED ?Medications  ?oxyCODONE-acetaminophen (PERCOCET/ROXICET) 5-325 MG per tablet 1 tablet (1 tablet Oral Given 06/04/21 0848)  ?propofol (DIPRIVAN) 10 mg/mL bolus/IV push 176 mg (has no administration in time range)  ?HYDROmorphone (DILAUDID) injection 1 mg (1 mg Intravenous Given 06/04/21 0924)  ?ondansetron (ZOFRAN) injection 4  mg (4 mg Intravenous Given 06/04/21 0922)  ?sodium chloride 0.9 % bolus 1,000 mL (0 mLs Intravenous Stopped 06/04/21 1057)  ?propofol (DIPRIVAN) 10 mg/mL bolus/IV push (20 mg Intravenous Given 06/04/21 0958)  ? ? ?ED Course/ Medical Decision Making/ A&P ?  ?                        ?Medical Decision Making ?Amount and/or Complexity of Data Reviewed ?Radiology: ordered. ? ?Risk ?Prescription drug management. ? ? ?This patient presents to the ED for concern of left wrist pain, this involves an extensive number of treatment options, and is a complaint that carries with it a high risk of complications and morbidity.  The differential diagnosis includes fracture, dislocation, compartment syndrome ? ? ? ?Additional history obtained: ? ?Additional history obtained from husband who is at bedside ?External records from outside source obtained and reviewed including N/A ? ? ?Co morbidities that complicate the patient  evaluation ? ?N/A ? ?Social Determinants of Health: ? ?N/A ? ? ? ?Lab Tests: ? ?I Ordered, and personally interpreted labs.  The pertinent results include: N/A ? ? ?Imaging Studies ordered: ? ?I ordered imaging studies incl

## 2021-06-04 NOTE — ED Notes (Signed)
Consent signed for conscious sedation with fracture reduction.  ?

## 2021-06-08 ENCOUNTER — Ambulatory Visit: Payer: Medicare HMO | Admitting: Orthopedic Surgery

## 2021-06-08 ENCOUNTER — Encounter: Payer: Self-pay | Admitting: Orthopedic Surgery

## 2021-06-08 VITALS — BP 107/51 | HR 64 | Ht 63.0 in | Wt 196.4 lb

## 2021-06-08 DIAGNOSIS — S52592A Other fractures of lower end of left radius, initial encounter for closed fracture: Secondary | ICD-10-CM | POA: Diagnosis not present

## 2021-06-08 DIAGNOSIS — S52502A Unspecified fracture of the lower end of left radius, initial encounter for closed fracture: Secondary | ICD-10-CM

## 2021-06-08 NOTE — Progress Notes (Signed)
Chief Complaint  ?Patient presents with  ? Wrist Injury  ?  LT wrist fracture ?DOI 06/04/21 ?Reduced in ED  ? ? 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  She had a closed reduction in the emergency room on May 10 with residual displacement noted ? ?She presents for evaluation and management complaining of some numbness and tingling and some sharp discomfort in the mid forearm ? ? ? ?Past Medical History:  ?Diagnosis Date  ? Anxiety   ? ? ?BP (!) 107/51   Pulse 64   Ht '5\' 3"'$  (1.6 m)   Wt 196 lb 6.4 oz (89.1 kg)   BMI 34.79 kg/m?  ? ? ?General appearance: Well-developed well-nourished no gross deformities ? ?Cardiovascular normal pulse and perfusion normal color without edema ? ?Neurologically no sensation loss or deficits or pathologic reflexes ? ?Psychological: Awake alert and oriented x3 mood and affect normal ? ?Skin no lacerations or ulcerations no nodularity no palpable masses, no erythema or nodularity ? ?Musculoskeletal: We remove the splint her fingers are very swollen as is her hand based on the way the splint was applied it was a sugar-tong ? ?However upon removing the splint the numbness tingling and the sharp sensation in the forearm was relieved ? ?We put her in a volar splint encouraged her to move her fingers ? ?Imaging first image at the hospital I will interpret.  She has a comminuted fracture at the distal radial metaphysis with radial displacement and dorsal angulation ? ?The second x-ray shows that the alignment has been improved but not reduced anatomic or acceptable position ? ? ? ?A/P ? ?Based on the comminution and the unusual mechanism of injury I have recommended that she see a hand specialist and I will make that referral ? ?She has been taking ibuprofen 4 tablets at a time and the Percocet she has not taken since her first day after being in the emergency room ? ? ?

## 2021-06-11 ENCOUNTER — Ambulatory Visit (INDEPENDENT_AMBULATORY_CARE_PROVIDER_SITE_OTHER): Payer: Medicare HMO | Admitting: Orthopedic Surgery

## 2021-06-11 ENCOUNTER — Encounter: Payer: Self-pay | Admitting: Orthopedic Surgery

## 2021-06-11 DIAGNOSIS — S52502A Unspecified fracture of the lower end of left radius, initial encounter for closed fracture: Secondary | ICD-10-CM | POA: Insufficient documentation

## 2021-06-11 DIAGNOSIS — S52572A Other intraarticular fracture of lower end of left radius, initial encounter for closed fracture: Secondary | ICD-10-CM

## 2021-06-11 NOTE — Progress Notes (Signed)
? ?Office Visit Note ?  ?Patient: Virginia Jones           ?Date of Birth: 03-16-1955           ?MRN: [id number] ?Visit Date: 06/11/2021 ?             ?Requested by: Carole Civil, MD ?[address] ?[place],  [place] [address] ?PCP: Sharilyn Sites, MD ? ? ?Assessment & Plan: ?Visit Diagnoses:  ?1. Other closed intra-articular fracture of distal end of left radius, initial encounter   ? ? ?Plan: We reviewed her x-rays which demonstrate a intra-articular fracture of the distal radius with coronal translation of the distal fragment and dorsal angulation.  We discussed the diagnosis, prognosis, and both conservative and operative treatment options for her distal radius fracture. ? ?After our discussion, the patient has elected to proceed with open reduction and internal fixation.  We reviewed the benefits of surgery and the potential risks including, but not limited to, persistent symptoms, infection, damage to nearby nerves and blood vessels, delayed wound healing, nonunion, malunion, need for additional surgery.   ? ?All patient concerns and questions were addressed. ? ?A surgical date will be confirmed with the patient.   ? ?Follow-Up Instructions: No follow-ups on file.  ? ?Orders:  ?No orders of the defined types were placed in this encounter. ? ?No orders of the defined types were placed in this encounter. ? ? ? ? Procedures: ?No procedures performed ? ? ?Clinical Data: ?No additional findings. ? ? ?Subjective: ?Chief Complaint  ?Patient presents with  ? Left Wrist - Fracture  ?  DOI:05/26/21. LEFT Handed, pain: 2-3/10, was walking down ramp and foot slipped on ice, and landed on her wrist  ? ? ?This is a 66 year old left-hand-dominant female who presents with a closed left distal radius fracture.  She was walking up the ramp on her porch last Monday when she slipped on some ice and landed on her outstretched left wrist.  She was seen in the ER where an attempt was made at reduction.  She was seen by one of my  colleagues in [place] and then referred to me for further care.  Her pain is well controlled currently on Advil.  Pain is 2-3/10 at worst.  She denies any numbness or paresthesias in her fingers.  She denies any pain elsewhere in the extremity. ? ? ?Review of Systems ? ? ?Objective: ?Vital Signs: BP (!) 159/79 (BP Location: Left Arm, Patient Position: Sitting)   Pulse 60   Ht '5\' 3"'$  (1.6 m)   Wt 196 lb 6.4 oz (89.1 kg)   BMI 34.79 kg/m?  ? ?Physical Exam ?Constitutional:   ?   Appearance: Normal appearance.  ?Cardiovascular:  ?   Rate and Rhythm: Normal rate.  ?   Pulses: Normal pulses.  ?Pulmonary:  ?   Effort: Pulmonary effort is normal.  ?Skin: ?   General: Skin is warm.  ?   Capillary Refill: Capillary refill takes less than 2 seconds.  ?Neurological:  ?   Mental Status: She is alert.  ? ? ?Left Hand Exam  ? ?Other  ?Erythema: absent ?Sensation: normal ?Pulse: present ? ?Comments:  Splint clean and dry.  Mild swelling of fingers.  Finger flexion and extension intact w/in limits of splint.  SILT throughout hand and fingers warm and well perfused.  ? ? ? ? ?Specialty Comments:  ?No specialty comments available. ? ?Imaging: ?No results found. ? ? ?PMFS History: ?Patient Active Problem List  ?  Diagnosis Date Noted  ? Closed fracture of left distal radius 06/11/2021  ? ?Past Medical History:  ?Diagnosis Date  ? Anxiety   ?  ?No family history on file.  ?Past Surgical History:  ?Procedure Laterality Date  ? CHOLECYSTECTOMY    ? COLONOSCOPY  09/26/2011  ? Procedure: COLONOSCOPY;  Surgeon: Rogene Houston, MD;  Location: [facility];  Service: Endoscopy;  Laterality: N/A;  1030  ? ear shunt left    ? ?Social History  ? ?Occupational History  ? Not on file  ?Tobacco Use  ? Smoking status: Never  ? Smokeless tobacco: Not on file  ?Substance and Sexual Activity  ? Alcohol use: Yes  ? Drug use: No  ? Sexual activity: Not on file  ? ? ? ? ? ? ?

## 2021-06-11 NOTE — H&P (View-Only) (Signed)
? ?Office Visit Note ?  ?Patient: Virginia Jones           ?Date of Birth: 08/17/1955           ?MRN: [id number] ?Visit Date: 06/11/2021 ?             ?Requested by: Carole Civil, MD ?[address] ?[place],  [place] [address] ?PCP: Sharilyn Sites, MD ? ? ?Assessment & Plan: ?Visit Diagnoses:  ?1. Other closed intra-articular fracture of distal end of left radius, initial encounter   ? ? ?Plan: We reviewed her x-rays which demonstrate a intra-articular fracture of the distal radius with coronal translation of the distal fragment and dorsal angulation.  We discussed the diagnosis, prognosis, and both conservative and operative treatment options for her distal radius fracture. ? ?After our discussion, the patient has elected to proceed with open reduction and internal fixation.  We reviewed the benefits of surgery and the potential risks including, but not limited to, persistent symptoms, infection, damage to nearby nerves and blood vessels, delayed wound healing, nonunion, malunion, need for additional surgery.   ? ?All patient concerns and questions were addressed. ? ?A surgical date will be confirmed with the patient.   ? ?Follow-Up Instructions: No follow-ups on file.  ? ?Orders:  ?No orders of the defined types were placed in this encounter. ? ?No orders of the defined types were placed in this encounter. ? ? ? ? Procedures: ?No procedures performed ? ? ?Clinical Data: ?No additional findings. ? ? ?Subjective: ?Chief Complaint  ?Patient presents with  ? Left Wrist - Fracture  ?  DOI:05/26/21. LEFT Handed, pain: 2-3/10, was walking down ramp and foot slipped on ice, and landed on her wrist  ? ? ?This is a 66 year old left-hand-dominant female who presents with a closed left distal radius fracture.  She was walking up the ramp on her porch last Monday when she slipped on some ice and landed on her outstretched left wrist.  She was seen in the ER where an attempt was made at reduction.  She was seen by one of my  colleagues in [place] and then referred to me for further care.  Her pain is well controlled currently on Advil.  Pain is 2-3/10 at worst.  She denies any numbness or paresthesias in her fingers.  She denies any pain elsewhere in the extremity. ? ? ?Review of Systems ? ? ?Objective: ?Vital Signs: BP (!) 159/79 (BP Location: Left Arm, Patient Position: Sitting)   Pulse 60   Ht '5\' 3"'$  (1.6 m)   Wt 196 lb 6.4 oz (89.1 kg)   BMI 34.79 kg/m?  ? ?Physical Exam ?Constitutional:   ?   Appearance: Normal appearance.  ?Cardiovascular:  ?   Rate and Rhythm: Normal rate.  ?   Pulses: Normal pulses.  ?Pulmonary:  ?   Effort: Pulmonary effort is normal.  ?Skin: ?   General: Skin is warm.  ?   Capillary Refill: Capillary refill takes less than 2 seconds.  ?Neurological:  ?   Mental Status: She is alert.  ? ? ?Left Hand Exam  ? ?Other  ?Erythema: absent ?Sensation: normal ?Pulse: present ? ?Comments:  Splint clean and dry.  Mild swelling of fingers.  Finger flexion and extension intact w/in limits of splint.  SILT throughout hand and fingers warm and well perfused.  ? ? ? ? ?Specialty Comments:  ?No specialty comments available. ? ?Imaging: ?No results found. ? ? ?PMFS History: ?Patient Active Problem List  ?  Diagnosis Date Noted  ? Closed fracture of left distal radius 06/11/2021  ? ?Past Medical History:  ?Diagnosis Date  ? Anxiety   ?  ?No family history on file.  ?Past Surgical History:  ?Procedure Laterality Date  ? CHOLECYSTECTOMY    ? COLONOSCOPY  09/26/2011  ? Procedure: COLONOSCOPY;  Surgeon: Virginia Houston, MD;  Location: [facility];  Service: Endoscopy;  Laterality: N/A;  1030  ? ear shunt left    ? ?Social History  ? ?Occupational History  ? Not on file  ?Tobacco Use  ? Smoking status: Never  ? Smokeless tobacco: Not on file  ?Substance and Sexual Activity  ? Alcohol use: Yes  ? Drug use: No  ? Sexual activity: Not on file  ? ? ? ? ? ? ?

## 2021-06-12 ENCOUNTER — Encounter (HOSPITAL_COMMUNITY): Payer: Self-pay | Admitting: Orthopedic Surgery

## 2021-06-12 ENCOUNTER — Other Ambulatory Visit: Payer: Self-pay

## 2021-06-12 NOTE — Progress Notes (Signed)
Mrs. Virginia Jones denies chest pain or shortness of breath. Patient denies having any s/s of Covid in her household.  Patient denies any known exposure to Covid.  ? ?PCP is Dr. Sharilyn Sites. ? ?I instructed [name] to shower with antibacteria soap.  DO not shave. No nail polish, artificial or acrylic nails. Wear clean clothes, brush your teeth. ?Glasses, contact lens,dentures or partials may not be worn in the OR. If you need to wear them, please bring a case for glasses, do not wear contacts or bring a case, the hospital does not have contact cases, dentures or partials will have to be removed , make sure they are clean, we will provide a denture cup to put them in. You will need some one to drive you home and a responsible person over the age of 62 to stay with you for the first 24 hours after surgery.  ? ? ?

## 2021-06-13 ENCOUNTER — Other Ambulatory Visit: Payer: Self-pay

## 2021-06-13 ENCOUNTER — Ambulatory Visit (HOSPITAL_COMMUNITY)
Admission: RE | Admit: 2021-06-13 | Discharge: 2021-06-13 | Disposition: A | Discharge status: Home / Self Care | Payer: Medicare HMO | Attending: Orthopedic Surgery | Admitting: Orthopedic Surgery

## 2021-06-13 ENCOUNTER — Encounter (HOSPITAL_COMMUNITY)
Admission: RE | Disposition: A | Discharge status: Home / Self Care | Payer: Self-pay | Source: Home / Self Care | Attending: Orthopedic Surgery

## 2021-06-13 ENCOUNTER — Ambulatory Visit (HOSPITAL_COMMUNITY): Payer: Medicare HMO | Admitting: Anesthesiology

## 2021-06-13 ENCOUNTER — Encounter (HOSPITAL_COMMUNITY): Payer: Self-pay | Admitting: Orthopedic Surgery

## 2021-06-13 ENCOUNTER — Ambulatory Visit (HOSPITAL_BASED_OUTPATIENT_CLINIC_OR_DEPARTMENT_OTHER): Payer: Medicare HMO | Admitting: Anesthesiology

## 2021-06-13 ENCOUNTER — Ambulatory Visit (HOSPITAL_COMMUNITY): Payer: Medicare HMO

## 2021-06-13 DIAGNOSIS — S52572A Other intraarticular fracture of lower end of left radius, initial encounter for closed fracture: Secondary | ICD-10-CM | POA: Insufficient documentation

## 2021-06-13 DIAGNOSIS — E669 Obesity, unspecified: Secondary | ICD-10-CM | POA: Diagnosis not present

## 2021-06-13 DIAGNOSIS — R69 Illness, unspecified: Secondary | ICD-10-CM | POA: Diagnosis not present

## 2021-06-13 DIAGNOSIS — Y9301 Activity, walking, marching and hiking: Secondary | ICD-10-CM | POA: Insufficient documentation

## 2021-06-13 DIAGNOSIS — Z79899 Other long term (current) drug therapy: Secondary | ICD-10-CM | POA: Insufficient documentation

## 2021-06-13 DIAGNOSIS — Y92009 Unspecified place in unspecified non-institutional (private) residence as the place of occurrence of the external cause: Secondary | ICD-10-CM | POA: Diagnosis not present

## 2021-06-13 DIAGNOSIS — I10 Essential (primary) hypertension: Secondary | ICD-10-CM

## 2021-06-13 DIAGNOSIS — F419 Anxiety disorder, unspecified: Secondary | ICD-10-CM | POA: Insufficient documentation

## 2021-06-13 DIAGNOSIS — S52502A Unspecified fracture of the lower end of left radius, initial encounter for closed fracture: Secondary | ICD-10-CM

## 2021-06-13 DIAGNOSIS — W1830XA Fall on same level, unspecified, initial encounter: Secondary | ICD-10-CM | POA: Insufficient documentation

## 2021-06-13 HISTORY — DX: Meniere's disease, unspecified ear: H81.09

## 2021-06-13 HISTORY — DX: Essential (primary) hypertension: I10

## 2021-06-13 HISTORY — PX: OPEN REDUCTION INTERNAL FIXATION (ORIF) DISTAL RADIAL FRACTURE: SHX5989

## 2021-06-13 LAB — BASIC METABOLIC PANEL
Anion gap: 10 (ref 5–15)
BUN: 13 mg/dL (ref 8–23)
CO2: 26 mmol/L (ref 22–32)
Calcium: 9.4 mg/dL (ref 8.9–10.3)
Chloride: 104 mmol/L (ref 98–111)
Creatinine, Ser: 0.82 mg/dL (ref 0.44–1.00)
GFR, Estimated: >60 mL/min (ref >60)
Glucose, Bld: 102 mg/dL — ABNORMAL HIGH (ref 70–99)
Potassium: 3.3 mmol/L — ABNORMAL LOW (ref 3.5–5.1)
Sodium: 140 mmol/L (ref 135–145)

## 2021-06-13 LAB — CBC
HCT: 41.3 % (ref 36.0–46.0)
Hemoglobin: 13.6 g/dL (ref 12.0–15.0)
MCH: 30.8 pg (ref 26.0–34.0)
MCHC: 32.9 g/dL (ref 30.0–36.0)
MCV: 93.7 fL (ref 80.0–100.0)
Platelets: 249 10*3/uL (ref 150–400)
RBC: 4.41 MIL/uL (ref 3.87–5.11)
RDW: 12.9 % (ref 11.5–15.5)
WBC: 8.6 10*3/uL (ref 4.0–10.5)
nRBC: 0 % (ref 0.0–0.2)

## 2021-06-13 SURGERY — OPEN REDUCTION INTERNAL FIXATION (ORIF) DISTAL RADIUS FRACTURE
Anesthesia: Monitor Anesthesia Care | Site: Arm Lower | Laterality: Left

## 2021-06-13 MED ORDER — FENTANYL CITRATE (PF) 100 MCG/2ML IJ SOLN: 25.0000 ug | INTRAMUSCULAR | Status: DC | PRN

## 2021-06-13 MED ORDER — MIDAZOLAM HCL 2 MG/2ML IJ SOLN
INTRAMUSCULAR | Status: AC
  Administered 2021-06-13: 2 mg
  Filled 2021-06-13: qty 2

## 2021-06-13 MED ORDER — PHENYLEPHRINE HCL-NACL 20-0.9 MG/250ML-% IV SOLN
INTRAVENOUS | Status: DC | PRN
  Administered 2021-06-13: 20 ug/min via INTRAVENOUS

## 2021-06-13 MED ORDER — LIDOCAINE 2% (20 MG/ML) 5 ML SYRINGE
INTRAMUSCULAR | Status: AC
  Filled 2021-06-13: qty 15

## 2021-06-13 MED ORDER — DEXAMETHASONE SODIUM PHOSPHATE 4 MG/ML IJ SOLN
INTRAMUSCULAR | Status: DC | PRN
  Administered 2021-06-13: 5 mg via PERINEURAL

## 2021-06-13 MED ORDER — SUCCINYLCHOLINE CHLORIDE 200 MG/10ML IV SOSY
PREFILLED_SYRINGE | INTRAVENOUS | Status: AC
  Filled 2021-06-13: qty 20

## 2021-06-13 MED ORDER — CLONIDINE HCL (ANALGESIA) 100 MCG/ML EP SOLN
EPIDURAL | Status: DC | PRN
Start: 2021-06-13 — End: 2021-06-13
  Administered 2021-06-13: 80 ug

## 2021-06-13 MED ORDER — DEXAMETHASONE SODIUM PHOSPHATE 10 MG/ML IJ SOLN
INTRAMUSCULAR | Status: AC
  Filled 2021-06-13: qty 2

## 2021-06-13 MED ORDER — AMISULPRIDE (ANTIEMETIC) 5 MG/2ML IV SOLN: 10.0000 mg | Freq: Once | INTRAVENOUS | Status: DC | PRN

## 2021-06-13 MED ORDER — CEFAZOLIN SODIUM-DEXTROSE 2-4 GM/100ML-% IV SOLN
INTRAVENOUS | Status: AC
  Filled 2021-06-13: qty 100

## 2021-06-13 MED ORDER — ORAL CARE MOUTH RINSE: 15.0000 mL | Freq: Once | OROMUCOSAL | Status: AC

## 2021-06-13 MED ORDER — ONDANSETRON HCL 4 MG/2ML IJ SOLN
INTRAMUSCULAR | Status: AC
  Filled 2021-06-13: qty 4

## 2021-06-13 MED ORDER — OXYCODONE HCL 5 MG PO TABS
5.0000 mg | ORAL_TABLET | ORAL | 0 refills | Status: AC | PRN
Start: 2021-06-13 — End: 2021-06-18

## 2021-06-13 MED ORDER — FENTANYL CITRATE (PF) 100 MCG/2ML IJ SOLN: 100.0000 ug | Freq: Once | INTRAMUSCULAR | Status: DC

## 2021-06-13 MED ORDER — PROPOFOL 10 MG/ML IV BOLUS
INTRAVENOUS | Status: DC | PRN
  Administered 2021-06-13 (×2): 50 mg via INTRAVENOUS

## 2021-06-13 MED ORDER — CHLORHEXIDINE GLUCONATE 0.12 % MT SOLN
OROMUCOSAL | Status: AC
  Administered 2021-06-13: 15 mL via OROMUCOSAL
  Filled 2021-06-13: qty 15

## 2021-06-13 MED ORDER — ROPIVACAINE HCL 5 MG/ML IJ SOLN: INTRAMUSCULAR | Status: DC | PRN

## 2021-06-13 MED ORDER — CHLORHEXIDINE GLUCONATE 0.12 % MT SOLN: 15.0000 mL | Freq: Once | OROMUCOSAL | Status: AC

## 2021-06-13 MED ORDER — ACETAMINOPHEN 500 MG PO TABS
ORAL_TABLET | ORAL | Status: AC
  Administered 2021-06-13: 1000 mg
  Filled 2021-06-13: qty 2

## 2021-06-13 MED ORDER — ROCURONIUM BROMIDE 10 MG/ML (PF) SYRINGE
PREFILLED_SYRINGE | INTRAVENOUS | Status: AC
  Filled 2021-06-13: qty 10

## 2021-06-13 MED ORDER — MIDAZOLAM HCL 2 MG/2ML IJ SOLN: 2.0000 mg | Freq: Once | INTRAMUSCULAR | Status: DC

## 2021-06-13 MED ORDER — PROPOFOL 500 MG/50ML IV EMUL
INTRAVENOUS | Status: DC | PRN
  Administered 2021-06-13 (×2): 100 ug/kg/min via INTRAVENOUS

## 2021-06-13 MED ORDER — CEFAZOLIN SODIUM-DEXTROSE 2-4 GM/100ML-% IV SOLN
2.0000 g | INTRAVENOUS | Status: AC
  Administered 2021-06-13: 2 g via INTRAVENOUS

## 2021-06-13 MED ORDER — ROPIVACAINE HCL 5 MG/ML IJ SOLN
INTRAMUSCULAR | Status: DC | PRN
Start: 2021-06-13 — End: 2021-06-13
  Administered 2021-06-13: 40 mL via PERINEURAL

## 2021-06-13 MED ORDER — ONDANSETRON HCL 4 MG/2ML IJ SOLN
INTRAMUSCULAR | Status: DC | PRN
  Administered 2021-06-13: 4 mg via INTRAVENOUS

## 2021-06-13 MED ORDER — LACTATED RINGERS IV SOLN: INTRAVENOUS | Status: DC

## 2021-06-13 MED ORDER — ONDANSETRON HCL 4 MG/2ML IJ SOLN
INTRAMUSCULAR | Status: AC
  Filled 2021-06-13: qty 2

## 2021-06-13 MED ORDER — FENTANYL CITRATE (PF) 100 MCG/2ML IJ SOLN
INTRAMUSCULAR | Status: AC
  Administered 2021-06-13: 100 ug
  Filled 2021-06-13: qty 2

## 2021-06-13 SURGICAL SUPPLY — 63 items
BAG COUNTER SPONGE SURGICOUNT (BAG) ×3 IMPLANT
BAG SPNG CNTER NS LX DISP (BAG) ×1
BIT DRILL SOLID 2.0X40MM (BIT) IMPLANT
BIT DRILL SOLID 2.5X40MM (BIT) IMPLANT
BLADE CLIPPER SURG (BLADE) IMPLANT
BNDG CMPR 9X4 STRL LF SNTH (GAUZE/BANDAGES/DRESSINGS) ×1
BNDG ELASTIC 3X5.8 VLCR STR LF (GAUZE/BANDAGES/DRESSINGS) ×2 IMPLANT
BNDG ELASTIC 4X5.8 VLCR STR LF (GAUZE/BANDAGES/DRESSINGS) ×3 IMPLANT
BNDG ESMARK 4X9 LF (GAUZE/BANDAGES/DRESSINGS) ×3 IMPLANT
BNDG GAUZE ELAST 4 BULKY (GAUZE/BANDAGES/DRESSINGS) ×3 IMPLANT
CANISTER SUCT 3000ML PPV (MISCELLANEOUS) ×3 IMPLANT
CORD BIPOLAR FORCEPS 12FT (ELECTRODE) ×3 IMPLANT
COVER SURGICAL LIGHT HANDLE (MISCELLANEOUS) ×3 IMPLANT
CUFF TOURN SGL QUICK 18X4 (TOURNIQUET CUFF) ×3 IMPLANT
CUFF TOURN SGL QUICK 24 (TOURNIQUET CUFF)
CUFF TRNQT CYL 24X4X16.5-23 (TOURNIQUET CUFF) IMPLANT
DECANTER SPIKE VIAL GLASS SM (MISCELLANEOUS) ×3 IMPLANT
DRAPE OEC MINIVIEW 54X84 (DRAPES) ×3 IMPLANT
DRAPE SURG 17X11 SM STRL (DRAPES) ×3 IMPLANT
DRILL SOLID 2.0X40MM (BIT) ×2
DRILL SOLID 2.5X40MM (BIT) ×2
GAUZE 4X4 16PLY ~~LOC~~+RFID DBL (SPONGE) ×3 IMPLANT
GAUZE XEROFORM 1X8 LF (GAUZE/BANDAGES/DRESSINGS) ×1 IMPLANT
GLOVE BIOGEL PI IND STRL 8.5 (GLOVE) ×2 IMPLANT
GLOVE BIOGEL PI INDICATOR 8.5 (GLOVE) ×1
GLOVE SURG ORTHO 8.0 STRL STRW (GLOVE) ×3 IMPLANT
GOWN STRL REUS W/ TWL LRG LVL3 (GOWN DISPOSABLE) ×2 IMPLANT
GOWN STRL REUS W/ TWL XL LVL3 (GOWN DISPOSABLE) ×2 IMPLANT
GOWN STRL REUS W/TWL LRG LVL3 (GOWN DISPOSABLE) ×2
GOWN STRL REUS W/TWL XL LVL3 (GOWN DISPOSABLE) ×2
GUIDE AIMING 1.5MM (WIRE) ×2 IMPLANT
K-WIRE SURGICAL 1.6X102 (WIRE) ×1 IMPLANT
KIT BASIN OR (CUSTOM PROCEDURE TRAY) ×3 IMPLANT
KIT TURNOVER KIT B (KITS) ×3 IMPLANT
NDL HYPO 25X1 1.5 SAFETY (NEEDLE) ×2 IMPLANT
NEEDLE HYPO 25X1 1.5 SAFETY (NEEDLE) ×2 IMPLANT
NS IRRIG 1000ML POUR BTL (IV SOLUTION) ×3 IMPLANT
PACK ORTHO EXTREMITY (CUSTOM PROCEDURE TRAY) ×3 IMPLANT
PAD ARMBOARD 7.5X6 YLW CONV (MISCELLANEOUS) ×6 IMPLANT
PAD CAST 4YDX4 CTTN HI CHSV (CAST SUPPLIES) ×2 IMPLANT
PADDING CAST COTTON 4X4 STRL (CAST SUPPLIES) ×2
PEG GEMINUS SMOOTH LOCK 2.0X19 (Peg) ×2 IMPLANT
PEG GEMINUS THRD TPNL 2.7X22 (Peg) ×2 IMPLANT
PEG SMOOTH LOCK 2.0X18 (Screw) ×4 IMPLANT
PLATE LEFT NARROW 3H (Plate) ×1 IMPLANT
PUTTY DBX 1CC (Putty) ×2 IMPLANT
PUTTY DBX 1CC DEPUY (Putty) IMPLANT
SCREW CORT LOCK 3.5X10 TI (Screw) ×2 IMPLANT
SCREW GEMINUS CORT LOCK 3.5X9 (Screw) ×1 IMPLANT
SCREW NONLOCK POLYAXIAL 3.5X10 (Screw) ×1 IMPLANT
SCREW POLY NON LOCK 3.5MMX12MM (Screw) ×1 IMPLANT
SCREWDRIVER SURG ST 2 (INSTRUMENTS) ×2 IMPLANT
SOAP 2 % CHG 4 OZ (WOUND CARE) ×3 IMPLANT
SPLINT FIBERGLASS 4X30 (CAST SUPPLIES) ×1 IMPLANT
SPONGE T-LAP 4X18 ~~LOC~~+RFID (SPONGE) ×3 IMPLANT
SUT ETHILON 4 0 P 3 18 (SUTURE) ×1 IMPLANT
SUT ETHILON 4 0 PS 2 18 (SUTURE) ×1 IMPLANT
SUT MNCRL AB 3-0 PS2 18 (SUTURE) ×1 IMPLANT
TOWEL GREEN STERILE (TOWEL DISPOSABLE) ×3 IMPLANT
TUBE CONNECTING 12X1/4 (SUCTIONS) ×3 IMPLANT
WATER STERILE IRR 1000ML POUR (IV SOLUTION) ×3 IMPLANT
WIRE FIX 1.5 STANDARD TIP (WIRE) ×8
WIRE FIX 1.5 STD TIP (WIRE) IMPLANT

## 2021-06-13 NOTE — Transfer of Care (Signed)
Immediate Anesthesia Transfer of Care Note ? ?Patient: Virginia Jones ? ?Procedure(s) Performed: LEFT OPEN REDUCTION INTERNAL FIXATION (ORIF) DISTAL RADIAL FRACTURE (Left: Arm Lower) ? ?Patient Location: PACU ? ?Anesthesia Type:MAC combined with regional for post-op pain ? ?Level of Consciousness: drowsy ? ?Airway & Oxygen Therapy: Patient Spontanous Breathing ? ?Post-op Assessment: Report given to RN and Post -op Vital signs reviewed and stable ? ?Post vital signs: Reviewed and stable ? ?Last Vitals:  ?Vitals Value Taken Time  ?BP 91/72 06/13/21 1549  ?Temp    ?Pulse 74 06/13/21 1550  ?Resp 16 06/13/21 1550  ?SpO2 95 % 06/13/21 1550  ?Vitals shown include unvalidated device data. ? ?Last Pain:  ?Vitals:  ? 06/13/21 1046  ?TempSrc:   ?PainSc: 0-No pain  ?   ? ?  ? ?Complications: No notable events documented. ?

## 2021-06-13 NOTE — Op Note (Signed)
? ?Date of Surgery: 06/13/2021 ? ?INDICATIONS: Patient is a 66 y.o.-year-old female with left distal radius fracture after a ground level fall while walking up a ramp at her home on 06/04/21.  An unsuccessful attempt at closed reduction was made in the emergency room at an outside hospital.  Risks, benefits, and alternatives to surgery were again discussed with the patient in the preoperative area. The patient wishes to proceed with surgery.  Informed consent was signed after our discussion.  ? ?PREOPERATIVE DIAGNOSIS:  ?Closed left distal radius fracture ? ?POSTOPERATIVE DIAGNOSIS: Same. ? ?PROCEDURE:  ?Open reduction internal fixation of left distal radius ? ? ?SURGEON: Audria Nine, M.D. ? ?ASSIST:  ? ?ANESTHESIA:  Regional, MAC ? ?IV FLUIDS AND URINE: See anesthesia. ? ?ESTIMATED BLOOD LOSS: 10 mL. ? ?IMPLANTS:  ?Implant Name Type Inv. Item Serial No. Manufacturer Lot No. LRB No. Used Action  ?PEG GEMINUS THRD TPNL 2.7X22 - [id number] Peg PEG GEMINUS THRD TPNL 2.7X22  [employer]KELET[employer]  Left 2 Implanted  ?PEG SMOOTH LOCK 2.0X18 - [id number] Screw PEG SMOOTH LOCK 2.0X18  SKELETAL [employer]YNAM[employer]  Left 4 Implanted  ?PEG GEMINUS SMOOTH LOCK 2.0X19 - [id number] Peg PEG GEMINUS SMOOTH LOCK 2.0X19  SKELETAL DYNAM[employer]  Left 2 Implanted  ?PLATE LEFT NARROW 3H - [id number] Plate PLATE LEFT NARROW 3H  SKELETAL DYNAMICS  Left 1 Implanted  ?SCREW GEMINUS CORT LOCK 3.5X9 - [id number] Screw SCREW GEMINUS CORT LOCK 3.5X9  SKELETAL DYNAMICS  Left 1 Implanted  ?SCREW CORT LOCK 3.5X10 TI - [id number] Screw SCREW CORT LOCK 3.5X10 TI  SKELETAL [employer]YNAM[employer]  Left 2 Implanted  ?SCREW NONLOCK POLYAXIAL 3.5X10 - [id number] Screw SCREW NONLOCK POLYAXIAL 3.5X10  SKELETAL DYNAMICS  Left 1 Implanted  ?SCREW POLY NON LOCK 3.9CVE93YB - [id number] Screw SCREW POLY NON LOCK 3.5IDP82UM  SKELETAL DYNAMICS  Left 1 Implanted  ?PUTTY DBX 1CC - [id number] Putty PUTTY DBX 1CC [id number] MUSCULOSKELETL TRANSPLANT FNDN  Left 1 Implanted  ?  ? ?DRAINS:  None ? ?COMPLICATIONS: None ? ?DESCRIPTION OF PROCEDURE: The patient was met in the preoperative holding area where the surgical site was marked and the consent form was verified.  The patient was then taken to the operating room and transferred to the operating table.  All bony prominences were well padded.  A tourniquet was applied to the left upper arm.  Monitored sedation was induced.  The operative extremity was prepped and draped in the usual and sterile fashion.  A formal time-out was performed to confirm that this was the correct patient, surgery, side, and site.  ? ?Following timeout, the limb was gently exsanguinated with an Esmarch bandage and the tourniquet inflated 250 mmHg.  An extended FCR approach was made to the distal radius.  The skin and subcutaneous tissue was divided.  The volar FCR tendon sheath was incised.  The superficial branch of the radial artery was identified and the distal aspect of the wound was protected.  The FCR tendon was retracted radially and the floor of the FCR sheath was incised.  Blunt dissection was used to develop Parona space between the FPL muscle belly and the pronator quadratus.  There was extensive stripping of the radial soft tissues with significant radial coronal translation of the distal fragment.  The brachial radialis tendon was identified and was released.  The tendons of the first dorsal compartment were identified and protected.  An L-shaped incision was made in the pronator quadratus along the volar and radial aspects of the radius.  The pronator was subperiosteally  dissected off of the volar aspect of the radius.  There was significant comminution of the ulnar two thirds of the radial metaphysis with some stripping of the soft tissue.  There was only a small cortical read at the radial aspect of the metaphysis.  The lobster claw was placed at the proximal segment.  The fracture was reduced with a combination of traction, flexion at the wrist, and radial  translation of the proximal segment.  A cortical read was obtained at the radial third of the metaphysis and a 0.062 k-wire was advanced from the radial styloid into the proximal segment to stabilize the fracture.  A left narrow Geminus plate was selected and applied to the volar surface of the radius.  The plate was positioned in appropriate position proximal to distal and the center oblong hole was drilled.  A lateral view showed that the plate was in appropriate position and that the trajectory of all screws appear to be subchondral.  2 k-wires were placed in the aiming guides of the ulnar and radial heads of the plate and a lateral x-ray again confirmed subchondral screw position.  A bicortical nonlocking screw was placed in the distal most hole of both the ulnar and radial heads of the plate to bring the plate down to the volar surface of the radius.  The remaining holes were then filled with smooth locking pegs taking care that they did not penetrate the dorsal surface of the radius.  The previously placed K wires and the aiming guides were removed.  These were replaced with smooth locking pegs.   ? ?The 2 proximal remaining holes in the shaft portion of the plate were then filled with bicortical locking screws.  The 2 previously placed bicortical screws in the distal segment of the plate were then replaced with smooth locking pegs again taking care of the pegs did not penetrate the dorsal surface of the radius. ? ?AP and lateral views showed that the fracture was well reduced and the plate was in appropriate position.  A 30 degree lateral view showed that all screws were in subchondral position.  There was a large void at the metaphysis from the midportion to the ulnar most aspect of the radius.  This void was filled with demineralized bone matrix putty and the stripped cortical fragments were trimmed with bone cutting forceps and placed into the void as well. ? ?The wound was then thoroughly irrigated with  copious sterile saline.  The tourniquet was let down hemostasis was achieved with direct pressure of the wound.  The skin was then closed in layered fashion using 3-0 Monocryl in a buried interrupted fashion followed by 4-0 nylon in a horizontal mattress fashion. ? ?The wound was then dressed with Xeroform, folded Kerlix, cast padding, and a well-padded volar splint was applied. ? ?The patient was then reversed from anesthesia and transferred the postoperative bed.  All counts were correct times 2 at the end of the procedure.  The patient was then taken to PACU in stable condition. ? ?POSTOPERATIVE PLAN: She will be discharged home with appropriate pain medication and discharge instructions.  I will see her in 10 to 14 days for her first postop visit.  She will be referred to hand therapy to work on edema control, range of motion, and have a Thermoplast splint fabricated. ? ?Audria Nine, MD ?3:48 PM  ?

## 2021-06-13 NOTE — Brief Op Note (Signed)
06/13/2021 ? ?3:43 PM ? ?PATIENT:  Virginia Jones  66 y.o. female ? ?PRE-OPERATIVE DIAGNOSIS:  LEFT DISTAL RADIUS FRACTURE ? ?POST-OPERATIVE DIAGNOSIS:  LEFT DISTAL RADIUS FRACTURE ? ?PROCEDURE:  Procedure(s): ?LEFT OPEN REDUCTION INTERNAL FIXATION (ORIF) DISTAL RADIAL FRACTURE (Left) ? ?SURGEON:  Surgeon(s) and Role: ?   * Sherilyn Cooter, MD - Primary ? ?PHYSICIAN ASSISTANT:  ? ?ASSISTANTS: none  ? ?ANESTHESIA:   regional and MAC ? ?EBL:  5 mL  ? ?BLOOD ADMINISTERED:none ? ?DRAINS: none  ? ?LOCAL MEDICATIONS USED:  NONE ? ?SPECIMEN:  No Specimen ? ?DISPOSITION OF SPECIMEN:  N/A ? ?COUNTS:  YES ? ?TOURNIQUET:   ?Total Tourniquet Time Documented: ?Upper Arm (Left) - 84 minutes ?Total: Upper Arm (Left) - 84 minutes ? ? ?DICTATION: .Dragon Dictation ? ?PLAN OF CARE: Discharge to home after PACU ? ?PATIENT DISPOSITION:  PACU - hemodynamically stable. ?  ?Delay start of Pharmacological VTE agent (>24hrs) due to surgical blood loss or risk of bleeding: not applicable ? ?

## 2021-06-13 NOTE — Anesthesia Procedure Notes (Signed)
Anesthesia Regional Block: Axillary brachial plexus block  ? ?Pre-Anesthetic Checklist: , timeout performed,  Correct Patient, Correct Site, Correct Laterality,  Correct Procedure, Correct Position, site marked,  Risks and benefits discussed,  Surgical consent,  Pre-op evaluation,  At surgeon's request and post-op pain management ? ?Laterality: Upper and Left ? ?Prep: chloraprep     ?  ?Needles:  ?Injection technique: Single-shot ? ?Needle Type: Stimiplex   ? ? ? ? ? ? ? ?Additional Needles: ? ? ?Procedures:,,,, ultrasound used (permanent image in chart),,    ?Narrative:  ?Start time: 06/13/2021 11:40 AM ?End time: 06/13/2021 12:00 PM ?Injection made incrementally with aspirations every 5 mL. ? ?Performed by: Personally  ?Anesthesiologist: Nolon Nations, MD ? ?Additional Notes: ?BP cuff, SpO2 and EKG monitors applied. Sedation begun. Nerve location verified with ultrasound. Anesthetic injected incrementally, slowly, and after neg aspirations under direct u/s guidance. Good perineural spread. Tolerated well. ? ? ? ? ?

## 2021-06-13 NOTE — Interval H&P Note (Signed)
History and Physical Interval Note: ? ?06/13/2021 ?11:54 AM ? ?Virginia Jones  has presented today for surgery, with the diagnosis of LEFT DISTAL RADIUS FRACTURE.  The various methods of treatment have been discussed with the patient and family. After consideration of risks, benefits and other options for treatment, the patient has consented to  Procedure(s): ?LEFT OPEN REDUCTION INTERNAL FIXATION (ORIF) DISTAL RADIAL FRACTURE (Left) as a surgical intervention.  The patient's history has been reviewed, patient examined, no change in status, stable for surgery.  I have reviewed the patient's chart and labs.  Questions were answered to the patient's satisfaction.   ? ? ?[name] ? ? ?

## 2021-06-13 NOTE — Discharge Instructions (Signed)
  Virginia Jones, M.D. Hand Surgery  POST-OPERATIVE DISCHARGE INSTRUCTIONS   PRESCRIPTIONS: You may have been given a prescription to be taken as directed for post-operative pain control.  You may also take over the counter ibuprofen/aleve and tylenol for pain. Take this as directed on the packaging. Do not exceed 3000 mg tylenol/acetaminophen in 24 hours.  Ibuprofen 600-800 mg (3-4) tablets by mouth every 6 hours as needed for pain.  OR Aleve 2 tablets by mouth every 12 hours (twice daily) as needed for pain.  AND/OR Tylenol 1000 mg (2 tablets) every 8 hours as needed for pain.  Please use your pain medication carefully, as refills are limited and you may not be provided with one.  As stated above, please use over the counter pain medicine - it will also be helpful with decreasing your swelling.    ANESTHESIA: After your surgery, post-surgical discomfort or pain is likely. This discomfort can last several days to a few weeks. At certain times of the day your discomfort may be more intense.   Did you receive a nerve block?  A nerve block can provide pain relief for one hour to two days after your surgery. As long as the nerve block is working, you will experience little or no sensation in the area the surgeon operated on.  As the nerve block wears off, you will begin to experience pain or discomfort. It is very important that you begin taking your prescribed pain medication before the nerve block fully wears off. Treating your pain at the first sign of the block wearing off will ensure your pain is better controlled and more tolerable when full-sensation returns. Do not wait until the pain is intolerable, as the medicine will be less effective. It is better to treat pain in advance than to try and catch up.   General Anesthesia:  If you did not receive a nerve block during your surgery, you will need to start taking your pain medication shortly after your surgery and should continue  to do so as prescribed by your surgeon.     ICE AND ELEVATION: You may use ice for the first 48-72 hours, but it is not critical.   Motion of your fingers is very important to decrease the swelling.  Elevation, as much as possible for the next 48 hours, is critical for decreasing swelling as well as for pain relief. Elevation means when you are seated or lying down, you hand should be at or above your heart. When walking, the hand needs to be at or above the level of your elbow.  If the bandage gets too tight, it may need to be loosened. Please contact our office and we will instruct you in how to do this.    SURGICAL BANDAGES:  Keep your dressing and/or splint clean and dry at all times.  Do not remove until you are seen again in the office.  If careful, you may place a plastic bag over your bandage and tape the end to shower, but be careful, do not get your bandages wet.     HAND THERAPY:  You may not need any. If you do, we will begin this at your follow up visit in the clinic.    ACTIVITY AND WORK: You are encouraged to move any fingers which are not in the bandage.  Light use of the fingers is allowed to assist the other hand with daily hygiene and eating, but strong gripping or lifting is often uncomfortable and   should be avoided.  You might miss a variable period of time from work and hopefully this issue has been discussed prior to surgery. You may not do any heavy work with your affected hand for about 2 weeks.    [facility] [address] [place],  [place]  [address] [phone]  

## 2021-06-13 NOTE — Anesthesia Preprocedure Evaluation (Addendum)
Anesthesia Evaluation  ?Patient identified by MRN, date of birth, ID band ?Patient awake ? ? ? ?Reviewed: ?Allergy & Precautions, NPO status , Patient's Chart, lab work & pertinent test results ? ?Airway ?Mallampati: II ? ?TM Distance: >3 FB ?Neck ROM: Full ? ? ? Dental ?no notable dental hx. ?(+) Dental Advisory Given, Teeth Intact ?  ?Pulmonary ?neg pulmonary ROS,  ?  ?Pulmonary exam normal ?breath sounds clear to auscultation ? ? ? ? ? ? Cardiovascular ?hypertension, Pt. on medications ?Normal cardiovascular exam ?Rhythm:Regular Rate:Normal ? ? ?  ?Neuro/Psych ?PSYCHIATRIC DISORDERS Anxiety negative neurological ROS ?   ? GI/Hepatic ?negative GI ROS, Neg liver ROS,   ?Endo/Other  ?negative endocrine ROS ? Renal/GU ?negative Renal ROS  ? ?  ?Musculoskeletal ?negative musculoskeletal ROS ?(+)  ? Abdominal ?(+) + obese,   ?Peds ? Hematology ?negative hematology ROS ?(+)   ?Anesthesia Other Findings ? ? Reproductive/Obstetrics ? ?  ? ? ? ? ? ? ? ? ? ? ? ? ? ?  ?  ? ? ? ? ? ? ? ?Anesthesia Physical ?Anesthesia Plan ? ?ASA: 2 ? ?Anesthesia Plan: Regional  ? ?Post-op Pain Management: Minimal or no pain anticipated  ? ?Induction: Intravenous ? ?PONV Risk Score and Plan: 2 and Treatment may vary due to age or medical condition, Propofol infusion, TIVA and Midazolam ? ?Airway Management Planned: Natural Airway ? ?Additional Equipment:  ? ?Intra-op Plan:  ? ?Post-operative Plan:  ? ?Informed Consent: I have reviewed the patients History and Physical, chart, labs and discussed the procedure including the risks, benefits and alternatives for the proposed anesthesia with the patient or authorized representative who has indicated his/her understanding and acceptance.  ? ? ? ?Dental advisory given ? ?Plan Discussed with: CRNA ? ?Anesthesia Plan Comments:   ? ? ? ? ? ?Anesthesia Quick Evaluation ? ?

## 2021-06-14 ENCOUNTER — Telehealth: Payer: Self-pay | Admitting: Orthopedic Surgery

## 2021-06-14 ENCOUNTER — Encounter (HOSPITAL_COMMUNITY): Payer: Self-pay | Admitting: Orthopedic Surgery

## 2021-06-14 NOTE — Telephone Encounter (Signed)
Please advise if patient is able to take OTC ibuprofen or tylenol for pain relief in between pain medication.  ?

## 2021-06-14 NOTE — Anesthesia Postprocedure Evaluation (Signed)
Anesthesia Post Note ? ?Patient: Virginia Jones ? ?Procedure(s) Performed: LEFT OPEN REDUCTION INTERNAL FIXATION (ORIF) DISTAL RADIAL FRACTURE (Left: Arm Lower) ? ?  ? ?Patient location during evaluation: PACU ?Anesthesia Type: Regional and MAC ?Level of consciousness: awake and alert ?Pain management: pain level controlled ?Vital Signs Assessment: post-procedure vital signs reviewed and stable ?Respiratory status: spontaneous breathing, nonlabored ventilation, respiratory function stable and patient connected to nasal cannula oxygen ?Cardiovascular status: stable and blood pressure returned to baseline ?Postop Assessment: no apparent nausea or vomiting ?Anesthetic complications: no ? ? ?No notable events documented. ? ?Last Vitals:  ?Vitals:  ? 06/13/21 1605 06/13/21 1619  ?BP: 98/86 99/63  ?Pulse: 71 69  ?Resp: 14 13  ?Temp:  36.7 ?C  ?SpO2: 97% 92%  ?  ?Last Pain:  ?Vitals:  ? 06/13/21 1619  ?TempSrc:   ?PainSc: 0-No pain  ? ? ?  ?  ?  ?  ?  ?  ? ?[place] ? ? ? ? ?

## 2021-06-14 NOTE — Telephone Encounter (Signed)
Patient called. She would like to know if she could take something else with the oxycodone for pain? Her call back number is [phone] ?

## 2021-06-14 NOTE — Telephone Encounter (Signed)
Contacted and informed her. She states understanding and has no further questions or concerns that she would like to discuss.  ?

## 2021-06-15 ENCOUNTER — Ambulatory Visit: Payer: Medicare HMO | Admitting: Orthopedic Surgery

## 2021-06-22 ENCOUNTER — Other Ambulatory Visit: Payer: Self-pay

## 2021-06-22 ENCOUNTER — Encounter: Payer: Self-pay | Admitting: Rehabilitative and Restorative Service Providers"

## 2021-06-22 ENCOUNTER — Ambulatory Visit (INDEPENDENT_AMBULATORY_CARE_PROVIDER_SITE_OTHER): Payer: Medicare HMO | Admitting: Orthopedic Surgery

## 2021-06-22 ENCOUNTER — Ambulatory Visit (INDEPENDENT_AMBULATORY_CARE_PROVIDER_SITE_OTHER): Payer: Medicare HMO | Admitting: Rehabilitative and Restorative Service Providers"

## 2021-06-22 ENCOUNTER — Ambulatory Visit (INDEPENDENT_AMBULATORY_CARE_PROVIDER_SITE_OTHER): Payer: Medicare HMO

## 2021-06-22 DIAGNOSIS — Z9889 Other specified postprocedural states: Secondary | ICD-10-CM | POA: Diagnosis not present

## 2021-06-22 DIAGNOSIS — Z8781 Personal history of (healed) traumatic fracture: Secondary | ICD-10-CM

## 2021-06-22 DIAGNOSIS — R600 Localized edema: Secondary | ICD-10-CM | POA: Diagnosis not present

## 2021-06-22 DIAGNOSIS — M25532 Pain in left wrist: Secondary | ICD-10-CM

## 2021-06-22 DIAGNOSIS — M6281 Muscle weakness (generalized): Secondary | ICD-10-CM

## 2021-06-22 DIAGNOSIS — M25642 Stiffness of left hand, not elsewhere classified: Secondary | ICD-10-CM | POA: Diagnosis not present

## 2021-06-22 DIAGNOSIS — R278 Other lack of coordination: Secondary | ICD-10-CM

## 2021-06-22 NOTE — Progress Notes (Signed)
? ?  Post-Op Visit Note ?  ?Patient: Virginia Jones           ?Date of Birth: Mar 08, 1955           ?MRN: [id number] ?Visit Date: 06/22/2021 ?PCP: Sharilyn Sites, MD ? ? ?Assessment & Plan: ? ?Chief Complaint:  ?Chief Complaint  ?Patient presents with  ? Left Wrist - Routine Post Op  ? ?Visit Diagnoses:  ?1. S/P ORIF (open reduction internal fixation) fracture   ? ? ?Plan: Patient is 9 days out s/p ORIF left distal radius fracture.  Her incision remains well approximated without surrounding erythema or induration.  X-rays today show maintained fracture alignment and hardware position.  She is seeing therapy today.  I will see her back in another two weeks.  ? ?Follow-Up Instructions: No follow-ups on file.  ? ?Orders:  ?Orders Placed This Encounter  ?Procedures  ? XR Wrist Complete Left  ? ?No orders of the defined types were placed in this encounter. ? ? ?Imaging: ?No results found. ? ?PMFS History: ?Patient Active Problem List  ? Diagnosis Date Noted  ? Closed fracture of left distal radius 06/11/2021  ? ?Past Medical History:  ?Diagnosis Date  ? Anxiety   ? Hypertension   ? Meniere disease   ? 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  ?No family history on file.  ?Past Surgical History:  ?Procedure Laterality Date  ? CHOLECYSTECTOMY    ? COLONOSCOPY  09/26/2011  ? Procedure: COLONOSCOPY;  Surgeon: Rogene Houston, MD;  [facility];  Service: Endoscopy;  Laterality: N/A;  1030  ? ear shunt left    ? OPEN REDUCTION INTERNAL FIXATION (ORIF) DISTAL RADIAL FRACTURE Left 06/13/2021  ? Procedure: LEFT OPEN REDUCTION INTERNAL FIXATION (ORIF) DISTAL RADIAL FRACTURE;  Surgeon: Sherilyn Cooter, MD;  Location: [place];  Service: Orthopedics;  Laterality: Left;  ? ?Social History  ? ?Occupational History  ? Not on file  ?Tobacco Use  ? Smoking status: Never  ? Smokeless tobacco: Never  ? Tobacco comments:  ?  "Smoked 1 year - age 5, second hand smoke most of live"  ?Vaping Use  ? Vaping Use: Never used  ?Substance and Sexual Activity  ?  Alcohol use: Yes  ?  Alcohol/week: 6.0 standard drinks  ?  Types: 6 Cans of beer per week  ? Drug use: No  ? Sexual activity: Not on file  ? ? ? ?

## 2021-06-22 NOTE — Therapy (Signed)
?OUTPATIENT OCCUPATIONAL THERAPY ORTHO EVALUATION ? ?Patient Name: Virginia Jones ?MRN: [id number] ?DOB:1955-06-09, 66 y.o., female ?Today's Date: 06/22/2021 ? ?PCP: Dr. Sharilyn Sites ?REFERRING PROVIDER: Dr Sherilyn Cooter ? ? OT End of Session - 06/22/21 1113   ? ? Visit Number 1   ? Number of Visits 14   ? Date for OT Re-Evaluation 08/24/21   ? Authorization Type [employer]   ? Progress Note Due on Visit 10   ? OT Start Time 1113   ? OT Stop Time 1213   ? OT Time Calculation (min) 60 min   ? Equipment Utilized During Treatment orthotic materials & edema control materials   ? Activity Tolerance Patient tolerated treatment well;No increased pain;Patient limited by pain   ? Behavior During Therapy [facility] for tasks assessed/performed   ? ?  ?  ? ?  ? ? ?Past Medical History:  ?Diagnosis Date  ? Anxiety   ? Hypertension   ? Meniere disease   ? 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 ?Past Surgical History:  ?Procedure Laterality Date  ? CHOLECYSTECTOMY    ? COLONOSCOPY  09/26/2011  ? Procedure: COLONOSCOPY;  Surgeon: Rogene Houston, MD;  [facility];  Service: Endoscopy;  Laterality: N/A;  1030  ? ear shunt left    ? OPEN REDUCTION INTERNAL FIXATION (ORIF) DISTAL RADIAL FRACTURE Left 06/13/2021  ? Procedure: LEFT OPEN REDUCTION INTERNAL FIXATION (ORIF) DISTAL RADIAL FRACTURE;  Surgeon: Sherilyn Cooter, MD;  Location: [place];  Service: Orthopedics;  Laterality: Left;  ? ?Patient Active Problem List  ? Diagnosis Date Noted  ? Closed fracture of left distal radius 06/11/2021  ? ? ?ONSET DATE: 06/13/21 DOS ? ?REFERRING DIAG: H37.169C (ICD-10-CM) - Other closed intra-articular fracture of distal end of left radius, initial encounter ? ?THERAPY DIAG:  ?Localized edema ? ?Muscle weakness (generalized) ? ?Other lack of coordination ? ?Pain in left wrist ? ?Stiffness of left hand, not elsewhere classified ? ?SUBJECTIVE:  ? ?SUBJECTIVE STATEMENT: ?She states falling 06/04/21 breaking left wrist. She hasn't had much pain. She is  retired and also going to be away next week for a beach vacation. She is cautioned to not get sx area wet yet or do anything forceful/painful.  ? ? ?PERTINENT HISTORY: Per MD: "Status post left distal radius fracture ORIF.  Large bone void from comminution so start slow with wrist/forearm ROM.  Thermoplast splint." "Patient is a 66 y.o.-year-old female with left distal radius fracture after a ground level fall while walking up a ramp at her home on 06/04/21.  An unsuccessful attempt at closed reduction was made in the emergency room at an outside hospital." ? ?PRECAUTIONS: 1 week post op Lt wrist ORIF & k-wire fixation, NWB in lt arm now  ? ? ?PAIN:  ?Are you having pain? Yes ?Rating: 1/10 now at rest, up to 3-4/10 at worst in past week  ? ?FALLS: Has patient fallen in last 6 months? Yes. Number of falls 1 (this accident)  ? ?LIVING ENVIRONMENT: ?Lives with: lives with their spouse ? ? ?PLOF: Independent ? ?PATIENT GOALS Get hand/wrist better to do daily activities  ? ?OBJECTIVE:  ? ?HAND DOMINANCE: Right ? ?ADLs: ?Overall ADLs: decreased ability, painful, and sometimes unable to: open containers, dress herself, meal prep, homecare, etc.  ? ?FUNCTIONAL OUTCOME MEASURES: ?Quick Dash: 57% impairment now ? ?UE ROM   06/22/21: she has ~5cm gap from pads of fingers to [facility] at eval, cannot oppose thumb to MF, RF or  SF.  Specific measures will be taken when more time.   Also due to bone void and MD notes, wrist motion not encouraged yet.  ? ?Active ROM Left ?06/22/2021  ?Shoulder flexion ~145  ?Shoulder abduction   ?Shoulder adduction   ?Shoulder extension   ?Shoulder internal rotation   ?Shoulder external rotation   ?Elbow flexion ~135  ?Elbow extension ~ (-25)  ?Wrist flexion   ?Wrist extension   ?Wrist ulnar deviation   ?Wrist radial deviation   ?Wrist pronation ~20  ?Wrist supination ~30  ?(Blank rows = not tested) ? ?UE MMT:   NT at eval  ? ?MMT Left ?06/22/2021  ?Shoulder flexion   ?Shoulder abduction   ?Shoulder  adduction   ?Shoulder extension   ?Shoulder internal rotation   ?Shoulder external rotation   ?Middle trapezius   ?Lower trapezius   ?Elbow flexion   ?Elbow extension   ?Wrist flexion   ?Wrist extension   ?Wrist ulnar deviation   ?Wrist radial deviation   ?Wrist pronation   ?Wrist supination   ?(Blank rows = not tested) ? ?HAND FUNCTION: ?Grip strength: Right: TBD lbs; Left: TBD lbs ? ?COORDINATION: ?9 Hole Peg test: Right: TBD sec; Left: TBD sec ?Box and Blocks:  Right TBDblocks, Left TBDblocks ? ?SENSATION: ?Intermittent hypersensitivity about scar per self-report at eval, and mildly diminished LT in hand due to mild swelling ? ?EDEMA: mild, diffuse about lt hand/wrist ? ?COGNITION: ?Overall cognitive status: Within functional limits for tasks assessed ? ?OBSERVATIONS: 06/22/21: sx area still healing up, no overt dehiscence now, she was cautioned to keep covered, clean, prevent forces that could cause dehiscence (sutures taken out ~1 week post op)  ? ? ?TODAY'S TREATMENT:  ?06/22/21 Eval:  Sx site self-care for healing incision education today and provided with materials to keep covered and clean. Also lightly compressive tubular gauze to help with swelling and prevent dehiscence.  ? ?Custom orthotic fabrication was indicated due to pt's healing left radius, bony void and need for safe, functional positioning. OT fabricated custom wrist immobilization  orthotic for pt today to protect left wrist. It fit well with no areas of pressure, pt states a comfortable fit. Pt was educated on the wearing schedule, to call or come in ASAP if it is causing any irritation or is not achieving desired function. It will be checked/adjusted in upcoming sessions, as needed. Pt states understanding.  ? ?OT also educates in exercises to do over next week as tolerated with no pain in the following ways. She demo's back fairly well after coaching without significant problems.  ? ?Exercises ?- Standing Shoulder Flexion Full Range  - 4-6 x  daily - 10-15 reps ?- Standing Elbow Flexion Extension AROM  - 4-6 x daily - 10-15 reps ?- Seated Forearm Pronation and Supination AROM  - 4-6 x daily - 1 sets - 10-15 reps ?- Thumb Opposition  - 3-4 x daily - 1 sets - 10 reps ?- Tendon Glides  - 3-4 x daily - 1 sets - 5- 10 reps - 2-3 seconds hold ? ? ?PATIENT EDUCATION: ?Education details: see tx section above for details  ?Person educated: Patient ?Education method: Explanation, Demonstration, Verbal cues, and Handouts ?Education comprehension: verbalized understanding, returned demonstration, verbal cues required, and needs further education ? ? ?HOME EXERCISE PROGRAM: ? ?Access Code: [email or web address] ?URL: [email or web address] ?Date: 06/22/2021 ?Prepared by: Benito Mccreedy ? ?GOALS: ?Goals reviewed with patient? Yes ? ?SHORT TERM GOALS: (STG required if POC>30 days) ? ?Pt will obtain protective, custom  orthotic. ?Target date: 06/22/21 ?Goal status: MET ? ?2.  Pt will demo/state understanding of initial HEP to improve pain levels and prerequisite motion. ?Target date: 07/06/21 ?Goal status: INITIAL ? ? ?LONG TERM GOALS: ? ?Pt will improve functional ability by decreased impairment per Quick DASH assessment from 57% to 10% or better, for better quality of life. ?Target date: 08/24/21 ?Goal status: INITIAL ? ?2.  Pt will improve grip strength in left hand to at least 45lbs for functional use at home and in IADLs. ?Target date: 08/24/21 ?Goal status: INITIAL ? ?3.  Pt will improve A/ROM in left wrist from TBD flex/ext to at least 60* each, to have functional motion for tasks like reach and grasp.  ?Target date: 08/24/21 ?Goal status: INITIAL ? ?4.  Pt will improve strength in left wrist to at least 4/5 MMT to have increased functional ability to carry out selfcare and higher-level homecare tasks with no difficulty. ?Target date: 08/24/21 ?Goal status: INITIAL ? ?5.  Pt will decrease pain at worst from 4/10 to 2/10 or better to have better sleep and occupational  participation in daily roles. ?Target date: 08/24/21 ?Goal status: INITIAL ? ?ASSESSMENT: ? ?CLINICAL IMPRESSION: ?Patient is a 66 y.o. female who was seen today for occupational therapy evaluation for broken left wr

## 2021-06-26 ENCOUNTER — Encounter: Payer: Medicare HMO | Admitting: Rehabilitative and Restorative Service Providers"

## 2021-06-28 ENCOUNTER — Encounter: Payer: Medicare HMO | Admitting: Rehabilitative and Restorative Service Providers"

## 2021-07-02 ENCOUNTER — Ambulatory Visit: Payer: Medicare HMO | Admitting: Rehabilitative and Restorative Service Providers"

## 2021-07-02 ENCOUNTER — Encounter: Payer: Self-pay | Admitting: Rehabilitative and Restorative Service Providers"

## 2021-07-02 DIAGNOSIS — M25532 Pain in left wrist: Secondary | ICD-10-CM | POA: Diagnosis not present

## 2021-07-02 DIAGNOSIS — M6281 Muscle weakness (generalized): Secondary | ICD-10-CM | POA: Diagnosis not present

## 2021-07-02 DIAGNOSIS — R278 Other lack of coordination: Secondary | ICD-10-CM | POA: Diagnosis not present

## 2021-07-02 DIAGNOSIS — M25642 Stiffness of left hand, not elsewhere classified: Secondary | ICD-10-CM | POA: Diagnosis not present

## 2021-07-02 DIAGNOSIS — R600 Localized edema: Secondary | ICD-10-CM | POA: Diagnosis not present

## 2021-07-02 NOTE — Therapy (Signed)
?OUTPATIENT OCCUPATIONAL THERAPY TREATMENT & DISCHARGE NOTE ? ? ?Patient Name: Virginia Jones ?MRN: [id number] ?DOB:[birth year], 66 y.o., female ?Today's Date: 07/02/2021 ? ?PCP: Dr. Sharilyn Sites ?REFERRING PROVIDER: Dr Sherilyn Cooter ? ?END OF SESSION:  ? OT End of Session - 07/02/21 0849   ? ? Visit Number 2   ? Number of Visits 14   ? Date for OT Re-Evaluation 08/24/21   ? Authorization Type [employer] Medicare   ? Progress Note Due on Visit 10   ? OT Start Time [phone]   ? OT Stop Time 0930   ? OT Time Calculation (min) 41 min   ? Activity Tolerance Patient tolerated treatment well;No increased pain;Patient limited by pain   ? Behavior During Therapy [facility] for tasks assessed/performed   ? ?  ?  ? ?  ? ? ?Past Medical History:  ?Diagnosis Date  ? Anxiety   ? Hypertension   ? Meniere disease   ? 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 ?Past Surgical History:  ?Procedure Laterality Date  ? CHOLECYSTECTOMY    ? COLONOSCOPY  09/26/2011  ? Procedure: COLONOSCOPY;  Surgeon: Rogene Houston, MD;  [facility];  Service: Endoscopy;  Laterality: N/A;  1030  ? ear shunt left    ? OPEN REDUCTION INTERNAL FIXATION (ORIF) DISTAL RADIAL FRACTURE Left 06/13/2021  ? Procedure: LEFT OPEN REDUCTION INTERNAL FIXATION (ORIF) DISTAL RADIAL FRACTURE;  Surgeon: Sherilyn Cooter, MD;  Location: [place];  Service: Orthopedics;  Laterality: Left;  ? ?Patient Active Problem List  ? Diagnosis Date Noted  ? Closed fracture of left distal radius 06/11/2021  ? ? ?ONSET DATE: 06/13/21 DOS ?  ?REFERRING DIAG: Y77.412I (ICD-10-CM) - Other closed intra-articular fracture of distal end of left radius, initial  ? ?THERAPY DIAG:  ?Localized edema ? ?Muscle weakness (generalized) ? ?Other lack of coordination ? ?Stiffness of left hand, not elsewhere classified ? ?Pain in left wrist ? ? ?PERTINENT HISTORY: Per MD: "Status post left distal radius fracture ORIF.  Large bone void from comminution so start slow with wrist/forearm ROM.  Thermoplast splint." "Patient is a 66  y.o.-year-old female with left distal radius fracture after a ground level fall while walking up a ramp at her home on 06/04/21.  An unsuccessful attempt at closed reduction was made in the emergency room at an outside hospital." ?  ?PRECAUTIONS: ~3 weeks post op Lt wrist ORIF & k-wire fixation, NWB in lt arm now  ? ?SUBJECTIVE:  ?She returns from week-long beach vacation for visit 2. She states not having significant pains during her trip. She does seem cautious to move her arm. She also states 40 min drive to therapy is not acceptable, and that she found a place closer to her home. She requests d/c from [facility] to attend therapy with a hand therapist closer to her home.  ? ?PAIN:  ?Are you having pain? No ?Rating: 0/10 at rest now ? ?OBJECTIVE: (All objective assessments below are from initial evaluation on: 06/22/21 unless otherwise specified.)  ? ?HAND DOMINANCE: Right ?  ?ADLs: ?Overall ADLs: decreased ability, painful, and sometimes unable to: open containers, dress herself, meal prep, homecare, etc.  ?  ?FUNCTIONAL OUTCOME MEASURES: ?Quick Dash: 57% impairment now ?  ?UE ROM   06/22/21: she has ~5cm gap from pads of fingers to [facility] at eval, cannot oppose thumb to MF, RF or SF.  Specific measures will be taken when more time.   Also due to bone void and MD notes,  wrist motion not encouraged yet.  ? ?07/02/21: 4.2cm gap from pad MF to [facility] today (better)  ?  ?Active ROM Left ?06/22/2021 Left ?07/02/21  ?Shoulder flexion ~145   ?Shoulder abduction     ?Shoulder adduction     ?Shoulder extension     ?Shoulder internal rotation     ?Shoulder external rotation     ?Elbow flexion ~135 156  ?Elbow extension ~ (-25) (-5)  ?Wrist flexion   22  ?Wrist extension   10  ?Wrist ulnar deviation     ?Wrist radial deviation     ?Wrist pronation ~20 35  ?Wrist supination ~30 60  ?(Blank rows = not tested) ?  ?UE MMT:   NT at eval due to fx ?  ?  ?HAND FUNCTION: ?Grip strength: Right: TBD lbs; Left: TBD lbs ?  ?COORDINATION: ?9 Hole Peg  test: Right: TBD sec; Left: TBD sec ?Box and Blocks:  Right TBDblocks, Left TBDblocks ?  ?SENSATION: ?Intermittent hypersensitivity about scar per self-report at eval, and mildly diminished LT in hand due to mild swelling ?  ?EDEMA: mild, diffuse about lt hand/wrist ?  ?COGNITION: ?Overall cognitive status: Within functional limits for tasks assessed ?  ?OBSERVATIONS: 06/22/21: sx area still healing up, no overt dehiscence now, she was cautioned to keep covered, clean, prevent forces that could cause dehiscence (sutures taken out ~1 week post op)  ?  ?  ?TODAY'S TREATMENT:  ?07/02/21: OT checks orthotic and makes slight adjustment to prevent pressure/rubbing. OT also reviews any symptoms and HEP, encouraging pain-free motions. OT upgrades HEP to include wrist motions now in 2 planes (as listed below- deviations not tolerated today) and she tolerates well, with no added pain. She was asked to stop/reduce frequency, if her wrist becomes painful or feels unstable. She was reminded of use of modalities for pain relief and swelling management as well. OT also reviews safety with home tasks an no weight bearing until directed by MD or therapy at the facility she will choose. She also was edu on scar massages with lotion, given scar pad and discusses self-care. She states understanding.  ? ?Exercises ?- Standing Shoulder Flexion Full Range  - 4-6 x daily - 10-15 reps ?- Standing Elbow Flexion Extension AROM  - 4-6 x daily - 10-15 reps ?- Seated Forearm Pronation and Supination AROM  - 4-6 x daily - 1 sets - 10-15 reps ?- Bend and Pull Back Wrist SLOWLY  - 3-4 x daily - 1-2 sets - 10-15 reps ?- Wrist AROM Dart Throwers Motion  - 3-4 x daily - 1-2 sets - 10-15 reps ?- Thumb Opposition  - 3-4 x daily - 1 sets - 10 reps ?- Tendon Glides  - 3-4 x daily - 1 sets - 5- 10 reps - 2-3 seconds hold ? ? ?06/22/21 Eval:  Sx site self-care for healing incision education today and provided with materials to keep covered and clean. Also  lightly compressive tubular gauze to help with swelling and prevent dehiscence.  ?  ?Custom orthotic fabrication was indicated due to pt's healing left radius, bony void and need for safe, functional positioning. OT fabricated custom wrist immobilization  orthotic for pt today to protect left wrist. It fit well with no areas of pressure, pt states a comfortable fit. Pt was educated on the wearing schedule, to call or come in ASAP if it is causing any irritation or is not achieving desired function. It will be checked/adjusted in upcoming sessions, as needed. Pt states  understanding.  ?  ?OT also educates in exercises to do over next week as tolerated with no pain in the following ways. She demo's back fairly well after coaching without significant problems.  ?  ?Exercises ?- Standing Shoulder Flexion Full Range  - 4-6 x daily - 10-15 reps ?- Standing Elbow Flexion Extension AROM  - 4-6 x daily - 10-15 reps ?- Seated Forearm Pronation and Supination AROM  - 4-6 x daily - 1 sets - 10-15 reps ?- Thumb Opposition  - 3-4 x daily - 1 sets - 10 reps ?- Tendon Glides  - 3-4 x daily - 1 sets - 5- 10 reps - 2-3 seconds hold ?  ?  ?PATIENT EDUCATION: ?Education details: see tx section above for details  ?Person educated: Patient ?Education method: Explanation, Demonstration, Verbal cues, and Handouts ?Education comprehension: verbalized understanding, returned demonstration, verbal cues required, and needs further education ?  ?  ?HOME EXERCISE PROGRAM: ?  ?Access Code: [email or web address] ?URL: [email or web address]  ?GOALS: ?Goals reviewed with patient? Yes ?  ?SHORT TERM GOALS: (STG required if POC>30 days) ?  ?Pt will obtain protective, custom orthotic. ?Target date: 06/22/21 ?Goal status: MET ?  ?2.  Pt will demo/state understanding of initial HEP to improve pain levels and prerequisite motion. ?Target date: 07/06/21 ?Goal status: 07/02/21: MET ?  ?  ?LONG TERM GOALS: ?  ?Pt will improve functional ability by decreased  impairment per Quick DASH assessment from 57% to 10% or better, for better quality of life. ?Target date: 08/24/21 ?Goal status: 07/02/21- not met yet, pt requests d/c  ?  ?2.  Pt will improve grip strength in l

## 2021-07-05 ENCOUNTER — Encounter: Payer: Medicare HMO | Admitting: Orthopedic Surgery

## 2021-07-05 ENCOUNTER — Encounter: Payer: Medicare HMO | Admitting: Rehabilitative and Restorative Service Providers"

## 2021-07-09 ENCOUNTER — Encounter: Payer: Medicare HMO | Admitting: Rehabilitative and Restorative Service Providers"

## 2021-07-09 DIAGNOSIS — M25632 Stiffness of left wrist, not elsewhere classified: Secondary | ICD-10-CM | POA: Diagnosis not present

## 2021-07-09 DIAGNOSIS — R531 Weakness: Secondary | ICD-10-CM | POA: Diagnosis not present

## 2021-07-09 DIAGNOSIS — M79642 Pain in left hand: Secondary | ICD-10-CM | POA: Diagnosis not present

## 2021-07-09 DIAGNOSIS — M25442 Effusion, left hand: Secondary | ICD-10-CM | POA: Diagnosis not present

## 2021-07-09 DIAGNOSIS — S52572D Other intraarticular fracture of lower end of left radius, subsequent encounter for closed fracture with routine healing: Secondary | ICD-10-CM | POA: Diagnosis not present

## 2021-07-09 DIAGNOSIS — M25642 Stiffness of left hand, not elsewhere classified: Secondary | ICD-10-CM | POA: Diagnosis not present

## 2021-07-11 ENCOUNTER — Encounter: Payer: Medicare HMO | Admitting: Rehabilitative and Restorative Service Providers"

## 2021-07-11 DIAGNOSIS — M25442 Effusion, left hand: Secondary | ICD-10-CM | POA: Diagnosis not present

## 2021-07-11 DIAGNOSIS — R531 Weakness: Secondary | ICD-10-CM | POA: Diagnosis not present

## 2021-07-11 DIAGNOSIS — M25642 Stiffness of left hand, not elsewhere classified: Secondary | ICD-10-CM | POA: Diagnosis not present

## 2021-07-11 DIAGNOSIS — S52572D Other intraarticular fracture of lower end of left radius, subsequent encounter for closed fracture with routine healing: Secondary | ICD-10-CM | POA: Diagnosis not present

## 2021-07-11 DIAGNOSIS — M79642 Pain in left hand: Secondary | ICD-10-CM | POA: Diagnosis not present

## 2021-07-11 DIAGNOSIS — M25632 Stiffness of left wrist, not elsewhere classified: Secondary | ICD-10-CM | POA: Diagnosis not present

## 2021-07-12 ENCOUNTER — Ambulatory Visit (INDEPENDENT_AMBULATORY_CARE_PROVIDER_SITE_OTHER): Payer: Medicare HMO | Admitting: Orthopedic Surgery

## 2021-07-12 ENCOUNTER — Ambulatory Visit: Payer: Self-pay

## 2021-07-12 ENCOUNTER — Encounter: Payer: Self-pay | Admitting: Orthopedic Surgery

## 2021-07-12 DIAGNOSIS — S52572A Other intraarticular fracture of lower end of left radius, initial encounter for closed fracture: Secondary | ICD-10-CM

## 2021-07-12 DIAGNOSIS — Z9889 Other specified postprocedural states: Secondary | ICD-10-CM

## 2021-07-12 DIAGNOSIS — Z8781 Personal history of (healed) traumatic fracture: Secondary | ICD-10-CM

## 2021-07-12 NOTE — Progress Notes (Signed)
   Post-Op Visit Note   Patient: Virginia Jones           Date of Birth: January 23, 1956           MRN: [id number] Visit Date: 07/12/2021 PCP: Sharilyn Sites, MD   Assessment & Plan:  Chief Complaint:  Chief Complaint  Patient presents with   Left Wrist - Routine Post Op   Visit Diagnoses:  1. S/P ORIF (open reduction internal fixation) fracture   2. Other closed intra-articular fracture of distal end of left radius, initial encounter     Plan: Patient is approximately 4 weeks status post ORIF of a left intra-articular distal radius fracture.  She is doing well postoperatively.  Repeat x-rays show unchanged fracture alignment and unchanged hardware position.  She has just started therapy this week.  She is still quite stiff with moderate wrist swelling.  She will start doing therapy twice per week.  I can see her back in another month.  Follow-Up Instructions: No follow-ups on file.   Orders:  Orders Placed This Encounter  Procedures   XR Wrist Complete Left   No orders of the defined types were placed in this encounter.   Imaging: No results found.  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 No pertinent family history.  Past Surgical History:  Procedure Laterality Date   CHOLECYSTECTOMY     COLONOSCOPY  09/26/2011   Procedure: COLONOSCOPY;  Surgeon: Rogene Houston, MD;  [facility];  Service: Endoscopy;  Laterality: N/A;  1030   ear shunt left     OPEN REDUCTION INTERNAL FIXATION (ORIF) DISTAL RADIAL FRACTURE Left 06/13/2021   Procedure: LEFT OPEN REDUCTION INTERNAL FIXATION (ORIF) DISTAL RADIAL FRACTURE;  Surgeon: Sherilyn Cooter, MD;  Location: [place];  Service: Orthopedics;  Laterality: Left;   Social History   Occupational History   Not on file   Tobacco Use   Smoking status: Never   Smokeless tobacco: Never   Tobacco comments:    "Smoked 1 year - age 43, second hand smoke most of live"  Vaping Use   Vaping Use: Never used  Substance and Sexual Activity   Alcohol use: Yes    Alcohol/week: 6.0 standard drinks    Types: 6 Cans of beer per week   Drug use: No   Sexual activity: Not on file

## 2021-07-16 ENCOUNTER — Encounter: Payer: Medicare HMO | Admitting: Rehabilitative and Restorative Service Providers"

## 2021-07-16 DIAGNOSIS — S52572D Other intraarticular fracture of lower end of left radius, subsequent encounter for closed fracture with routine healing: Secondary | ICD-10-CM | POA: Diagnosis not present

## 2021-07-16 DIAGNOSIS — R531 Weakness: Secondary | ICD-10-CM | POA: Diagnosis not present

## 2021-07-16 DIAGNOSIS — M25642 Stiffness of left hand, not elsewhere classified: Secondary | ICD-10-CM | POA: Diagnosis not present

## 2021-07-16 DIAGNOSIS — M25442 Effusion, left hand: Secondary | ICD-10-CM | POA: Diagnosis not present

## 2021-07-16 DIAGNOSIS — M25632 Stiffness of left wrist, not elsewhere classified: Secondary | ICD-10-CM | POA: Diagnosis not present

## 2021-07-16 DIAGNOSIS — M79642 Pain in left hand: Secondary | ICD-10-CM | POA: Diagnosis not present

## 2021-07-18 ENCOUNTER — Encounter: Payer: Medicare HMO | Admitting: Rehabilitative and Restorative Service Providers"

## 2021-07-18 DIAGNOSIS — M25632 Stiffness of left wrist, not elsewhere classified: Secondary | ICD-10-CM | POA: Diagnosis not present

## 2021-07-18 DIAGNOSIS — M25642 Stiffness of left hand, not elsewhere classified: Secondary | ICD-10-CM | POA: Diagnosis not present

## 2021-07-18 DIAGNOSIS — M25442 Effusion, left hand: Secondary | ICD-10-CM | POA: Diagnosis not present

## 2021-07-18 DIAGNOSIS — S52572D Other intraarticular fracture of lower end of left radius, subsequent encounter for closed fracture with routine healing: Secondary | ICD-10-CM | POA: Diagnosis not present

## 2021-07-18 DIAGNOSIS — M79642 Pain in left hand: Secondary | ICD-10-CM | POA: Diagnosis not present

## 2021-07-18 DIAGNOSIS — R531 Weakness: Secondary | ICD-10-CM | POA: Diagnosis not present

## 2021-07-24 ENCOUNTER — Encounter: Payer: Medicare HMO | Admitting: Rehabilitative and Restorative Service Providers"

## 2021-07-24 DIAGNOSIS — M25642 Stiffness of left hand, not elsewhere classified: Secondary | ICD-10-CM | POA: Diagnosis not present

## 2021-07-24 DIAGNOSIS — M79642 Pain in left hand: Secondary | ICD-10-CM | POA: Diagnosis not present

## 2021-07-24 DIAGNOSIS — M25632 Stiffness of left wrist, not elsewhere classified: Secondary | ICD-10-CM | POA: Diagnosis not present

## 2021-07-24 DIAGNOSIS — M25442 Effusion, left hand: Secondary | ICD-10-CM | POA: Diagnosis not present

## 2021-07-24 DIAGNOSIS — R531 Weakness: Secondary | ICD-10-CM | POA: Diagnosis not present

## 2021-07-24 DIAGNOSIS — S52572D Other intraarticular fracture of lower end of left radius, subsequent encounter for closed fracture with routine healing: Secondary | ICD-10-CM | POA: Diagnosis not present

## 2021-07-25 DIAGNOSIS — M25642 Stiffness of left hand, not elsewhere classified: Secondary | ICD-10-CM | POA: Diagnosis not present

## 2021-07-25 DIAGNOSIS — S52572D Other intraarticular fracture of lower end of left radius, subsequent encounter for closed fracture with routine healing: Secondary | ICD-10-CM | POA: Diagnosis not present

## 2021-07-25 DIAGNOSIS — M25632 Stiffness of left wrist, not elsewhere classified: Secondary | ICD-10-CM | POA: Diagnosis not present

## 2021-07-25 DIAGNOSIS — M79642 Pain in left hand: Secondary | ICD-10-CM | POA: Diagnosis not present

## 2021-07-25 DIAGNOSIS — M25442 Effusion, left hand: Secondary | ICD-10-CM | POA: Diagnosis not present

## 2021-07-25 DIAGNOSIS — R531 Weakness: Secondary | ICD-10-CM | POA: Diagnosis not present

## 2021-07-26 ENCOUNTER — Encounter: Payer: Medicare HMO | Admitting: Rehabilitative and Restorative Service Providers"

## 2021-07-30 DIAGNOSIS — M79642 Pain in left hand: Secondary | ICD-10-CM | POA: Diagnosis not present

## 2021-07-30 DIAGNOSIS — R531 Weakness: Secondary | ICD-10-CM | POA: Diagnosis not present

## 2021-07-30 DIAGNOSIS — M25442 Effusion, left hand: Secondary | ICD-10-CM | POA: Diagnosis not present

## 2021-07-30 DIAGNOSIS — M25632 Stiffness of left wrist, not elsewhere classified: Secondary | ICD-10-CM | POA: Diagnosis not present

## 2021-07-30 DIAGNOSIS — M25642 Stiffness of left hand, not elsewhere classified: Secondary | ICD-10-CM | POA: Diagnosis not present

## 2021-07-30 DIAGNOSIS — S52572D Other intraarticular fracture of lower end of left radius, subsequent encounter for closed fracture with routine healing: Secondary | ICD-10-CM | POA: Diagnosis not present

## 2021-08-01 DIAGNOSIS — S52572D Other intraarticular fracture of lower end of left radius, subsequent encounter for closed fracture with routine healing: Secondary | ICD-10-CM | POA: Diagnosis not present

## 2021-08-01 DIAGNOSIS — M79642 Pain in left hand: Secondary | ICD-10-CM | POA: Diagnosis not present

## 2021-08-01 DIAGNOSIS — M25442 Effusion, left hand: Secondary | ICD-10-CM | POA: Diagnosis not present

## 2021-08-01 DIAGNOSIS — M25642 Stiffness of left hand, not elsewhere classified: Secondary | ICD-10-CM | POA: Diagnosis not present

## 2021-08-01 DIAGNOSIS — M25632 Stiffness of left wrist, not elsewhere classified: Secondary | ICD-10-CM | POA: Diagnosis not present

## 2021-08-01 DIAGNOSIS — R531 Weakness: Secondary | ICD-10-CM | POA: Diagnosis not present

## 2021-08-06 DIAGNOSIS — M25632 Stiffness of left wrist, not elsewhere classified: Secondary | ICD-10-CM | POA: Diagnosis not present

## 2021-08-06 DIAGNOSIS — M25642 Stiffness of left hand, not elsewhere classified: Secondary | ICD-10-CM | POA: Diagnosis not present

## 2021-08-06 DIAGNOSIS — R531 Weakness: Secondary | ICD-10-CM | POA: Diagnosis not present

## 2021-08-06 DIAGNOSIS — M79642 Pain in left hand: Secondary | ICD-10-CM | POA: Diagnosis not present

## 2021-08-06 DIAGNOSIS — M25442 Effusion, left hand: Secondary | ICD-10-CM | POA: Diagnosis not present

## 2021-08-06 DIAGNOSIS — S52572D Other intraarticular fracture of lower end of left radius, subsequent encounter for closed fracture with routine healing: Secondary | ICD-10-CM | POA: Diagnosis not present

## 2021-08-09 ENCOUNTER — Ambulatory Visit (INDEPENDENT_AMBULATORY_CARE_PROVIDER_SITE_OTHER): Payer: Medicare HMO

## 2021-08-09 ENCOUNTER — Ambulatory Visit: Payer: Medicare HMO | Admitting: Orthopedic Surgery

## 2021-08-09 ENCOUNTER — Encounter: Payer: Self-pay | Admitting: Orthopedic Surgery

## 2021-08-09 DIAGNOSIS — S52572A Other intraarticular fracture of lower end of left radius, initial encounter for closed fracture: Secondary | ICD-10-CM

## 2021-08-09 DIAGNOSIS — Z8781 Personal history of (healed) traumatic fracture: Secondary | ICD-10-CM

## 2021-08-09 DIAGNOSIS — Z9889 Other specified postprocedural states: Secondary | ICD-10-CM

## 2021-08-09 NOTE — Progress Notes (Signed)
   Post-Op Visit Note   Patient:  Virginia Jones           Date of Birth: December 08, 1955           MRN: [id number] Visit Date: 08/09/2021 PCP: Sharilyn Sites, MD   Assessment & Plan:  Chief Complaint:  Chief Complaint  Patient presents with   Left Wrist - Routine Post Op   Visit Diagnoses:  1. S/P ORIF (open reduction internal fixation) fracture   2. Other closed intra-articular fracture of distal end of left radius, initial encounter     Plan: Patient is 8 weeks s/p ORIF of her left distal radius fracture.  She has been working hard with therapy but still has limited wrist and finger ROM.  She lacks approximately 2 cm from the [facility] with finger flexion.  Her wrist ROM is 20/30.  She has no pain. X-rays show unchanged fracture alignment and hardware position. She will continue to work hard with therapy and I will see her in another month.   Follow-Up Instructions: No follow-ups on file.   Orders:  Orders Placed This Encounter  Procedures   XR Wrist Complete Left   No orders of the defined types were placed in this encounter.   Imaging: No results found.  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 No pertinent family history.  Past Surgical History:  Procedure Laterality Date   CHOLECYSTECTOMY     COLONOSCOPY  09/26/2011   Procedure: COLONOSCOPY;  Surgeon: Rogene Houston, MD;  [facility];  Service: Endoscopy;  Laterality: N/A;  1030   ear shunt left     OPEN REDUCTION INTERNAL FIXATION (ORIF) DISTAL RADIAL FRACTURE Left 06/13/2021   Procedure: LEFT OPEN REDUCTION INTERNAL FIXATION (ORIF) DISTAL RADIAL FRACTURE;  Surgeon: Sherilyn Cooter, MD;  Location: [place];  Service: Orthopedics;  Laterality: Left;   Social History   Occupational History   Not on file   Tobacco Use   Smoking status: Never   Smokeless tobacco: Never   Tobacco comments:    "Smoked 1 year - age 89, second hand smoke most of live"  Vaping Use   Vaping Use: Never used  Substance and Sexual Activity   Alcohol use: Yes    Alcohol/week: 6.0 standard drinks of alcohol    Types: 6 Cans of beer per week   Drug use: No   Sexual activity: Not on file

## 2021-08-15 DIAGNOSIS — M25632 Stiffness of left wrist, not elsewhere classified: Secondary | ICD-10-CM | POA: Diagnosis not present

## 2021-08-15 DIAGNOSIS — M25442 Effusion, left hand: Secondary | ICD-10-CM | POA: Diagnosis not present

## 2021-08-15 DIAGNOSIS — M25642 Stiffness of left hand, not elsewhere classified: Secondary | ICD-10-CM | POA: Diagnosis not present

## 2021-08-15 DIAGNOSIS — S52572D Other intraarticular fracture of lower end of left radius, subsequent encounter for closed fracture with routine healing: Secondary | ICD-10-CM | POA: Diagnosis not present

## 2021-08-15 DIAGNOSIS — M79642 Pain in left hand: Secondary | ICD-10-CM | POA: Diagnosis not present

## 2021-08-15 DIAGNOSIS — R531 Weakness: Secondary | ICD-10-CM | POA: Diagnosis not present

## 2021-08-16 DIAGNOSIS — Z1331 Encounter for screening for depression: Secondary | ICD-10-CM | POA: Diagnosis not present

## 2021-08-16 DIAGNOSIS — E6609 Other obesity due to excess calories: Secondary | ICD-10-CM | POA: Diagnosis not present

## 2021-08-16 DIAGNOSIS — I10 Essential (primary) hypertension: Secondary | ICD-10-CM | POA: Diagnosis not present

## 2021-08-16 DIAGNOSIS — R69 Illness, unspecified: Secondary | ICD-10-CM | POA: Diagnosis not present

## 2021-08-16 DIAGNOSIS — Z0001 Encounter for general adult medical examination with abnormal findings: Secondary | ICD-10-CM | POA: Diagnosis not present

## 2021-08-16 DIAGNOSIS — Z6836 Body mass index (BMI) 36.0-36.9, adult: Secondary | ICD-10-CM | POA: Diagnosis not present

## 2021-08-16 DIAGNOSIS — I4510 Unspecified right bundle-branch block: Secondary | ICD-10-CM | POA: Diagnosis not present

## 2021-08-22 DIAGNOSIS — R531 Weakness: Secondary | ICD-10-CM | POA: Diagnosis not present

## 2021-08-22 DIAGNOSIS — M25442 Effusion, left hand: Secondary | ICD-10-CM | POA: Diagnosis not present

## 2021-08-22 DIAGNOSIS — S52572D Other intraarticular fracture of lower end of left radius, subsequent encounter for closed fracture with routine healing: Secondary | ICD-10-CM | POA: Diagnosis not present

## 2021-08-22 DIAGNOSIS — M25642 Stiffness of left hand, not elsewhere classified: Secondary | ICD-10-CM | POA: Diagnosis not present

## 2021-08-22 DIAGNOSIS — M79642 Pain in left hand: Secondary | ICD-10-CM | POA: Diagnosis not present

## 2021-08-22 DIAGNOSIS — M25632 Stiffness of left wrist, not elsewhere classified: Secondary | ICD-10-CM | POA: Diagnosis not present

## 2021-08-27 DIAGNOSIS — M25632 Stiffness of left wrist, not elsewhere classified: Secondary | ICD-10-CM | POA: Diagnosis not present

## 2021-08-27 DIAGNOSIS — M79642 Pain in left hand: Secondary | ICD-10-CM | POA: Diagnosis not present

## 2021-08-27 DIAGNOSIS — R531 Weakness: Secondary | ICD-10-CM | POA: Diagnosis not present

## 2021-08-27 DIAGNOSIS — M25642 Stiffness of left hand, not elsewhere classified: Secondary | ICD-10-CM | POA: Diagnosis not present

## 2021-08-27 DIAGNOSIS — M25442 Effusion, left hand: Secondary | ICD-10-CM | POA: Diagnosis not present

## 2021-08-27 DIAGNOSIS — S52572D Other intraarticular fracture of lower end of left radius, subsequent encounter for closed fracture with routine healing: Secondary | ICD-10-CM | POA: Diagnosis not present

## 2021-09-05 DIAGNOSIS — M79642 Pain in left hand: Secondary | ICD-10-CM | POA: Diagnosis not present

## 2021-09-05 DIAGNOSIS — M25632 Stiffness of left wrist, not elsewhere classified: Secondary | ICD-10-CM | POA: Diagnosis not present

## 2021-09-05 DIAGNOSIS — S52572D Other intraarticular fracture of lower end of left radius, subsequent encounter for closed fracture with routine healing: Secondary | ICD-10-CM | POA: Diagnosis not present

## 2021-09-05 DIAGNOSIS — M25442 Effusion, left hand: Secondary | ICD-10-CM | POA: Diagnosis not present

## 2021-09-05 DIAGNOSIS — M25642 Stiffness of left hand, not elsewhere classified: Secondary | ICD-10-CM | POA: Diagnosis not present

## 2021-09-05 DIAGNOSIS — R531 Weakness: Secondary | ICD-10-CM | POA: Diagnosis not present

## 2021-09-06 ENCOUNTER — Ambulatory Visit: Payer: Medicare HMO | Admitting: Orthopedic Surgery

## 2021-09-11 ENCOUNTER — Ambulatory Visit: Payer: Medicare HMO | Admitting: Orthopedic Surgery

## 2021-09-18 ENCOUNTER — Ambulatory Visit: Payer: Self-pay

## 2021-09-18 ENCOUNTER — Ambulatory Visit: Payer: Medicare HMO | Admitting: Orthopedic Surgery

## 2021-09-18 DIAGNOSIS — Z9889 Other specified postprocedural states: Secondary | ICD-10-CM

## 2021-09-18 DIAGNOSIS — Z8781 Personal history of (healed) traumatic fracture: Secondary | ICD-10-CM

## 2021-09-18 NOTE — Progress Notes (Signed)
Office Visit Note   Patient: Virginia Jones           Date of Birth: 1955-03-18           MRN: [id number] Visit Date: 09/18/2021              Requested by: Sharilyn Sites, [address] [place],  [place] [id number] PCP: Sharilyn Sites, MD   Assessment & Plan: Visit Diagnoses:  1. S/P ORIF (open reduction internal fixation) fracture     Plan: Patient is 14 weeks status post ORIF of her left distal radius fracture.  Doing very well today.  She has no pain in the wrist with near full range of motion.  Her only issue is some stiffness in the PIP and DIP joints of all of her fingers.  Has not been to therapy in some time now.  She has been working on finger range of motion exercises at home.  She is not interested in a formal therapy referral now and like to continue to work on her own.  Repeat x-rays today show a healed distal radius fracture with no evidence of hardware issue.  She can follow-up again with me as needed.  Follow-Up Instructions: No follow-ups on file.   Orders:  Orders Placed This Encounter  Procedures   XR Wrist Complete Left   No orders of the defined types were placed in this encounter.     Procedures: No procedures performed   Clinical Data: No additional findings.   Subjective: Chief Complaint  Patient presents with   Left Wrist - Follow-up, Fracture    Is a 66 year old female who is now approximately 14 weeks status post ORIF of her left distal radius fracture.  She doing very well today.  She has no complaints.  She has no pain in the wrist.  She is able to do all of her normal activities.  Her only issue is that she has some stiffness in the PIP and DIP joints of all of her fingers.  He has not been to therapy in some time.  She is been working on home exercises to improve her finger range of motion.  She is overall pleased with her result so far.    Review of Systems   Objective: Vital Signs: There were no vitals taken for this  visit.  Physical Exam  Left Hand Exam   Tenderness  The patient is experiencing no tenderness.   Other  Erythema: absent Sensation: normal Pulse: present  Comments:  Near full ROM of wrist in flexion/extension/radial/ulnar deviation.  She has full pronation/supination.  She lacks approx 1-2 cm from the [facility] with finger flexion secondary to PIP and DIP stiffness.       Specialty Comments:  No specialty comments available.  Imaging: No results found.   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  Past Surgical History:  Procedure Laterality Date   CHOLECYSTECTOMY     COLONOSCOPY  09/26/2011   Procedure: COLONOSCOPY;  Surgeon: Rogene Houston, MD;  [facility];  Service: Endoscopy;  Laterality: N/A;  1030   ear shunt left     OPEN REDUCTION INTERNAL FIXATION (ORIF) DISTAL RADIAL FRACTURE Left 06/13/2021  Procedure: LEFT OPEN REDUCTION INTERNAL FIXATION (ORIF) DISTAL RADIAL FRACTURE;  Surgeon: Sherilyn Cooter, MD;  Location: [facility];  Service: Orthopedics;  Laterality: Left;   Social History   Occupational History   Not on file  Tobacco Use   Smoking status: Never   Smokeless tobacco: Never   Tobacco comments:    "Smoked 1 year - age 43, second hand smoke most of live"  Vaping Use   Vaping Use: Never used  Substance and Sexual Activity   Alcohol use: Yes    Alcohol/week: 6.0 standard drinks of alcohol    Types: 6 Cans of beer per week   Drug use: No   Sexual activity: Not on file

## 2021-12-31 DIAGNOSIS — L814 Other melanin hyperpigmentation: Secondary | ICD-10-CM | POA: Diagnosis not present

## 2021-12-31 DIAGNOSIS — L578 Other skin changes due to chronic exposure to nonionizing radiation: Secondary | ICD-10-CM | POA: Diagnosis not present

## 2021-12-31 DIAGNOSIS — D2271 Melanocytic nevi of right lower limb, including hip: Secondary | ICD-10-CM | POA: Diagnosis not present

## 2021-12-31 DIAGNOSIS — L821 Other seborrheic keratosis: Secondary | ICD-10-CM | POA: Diagnosis not present

## 2021-12-31 DIAGNOSIS — L570 Actinic keratosis: Secondary | ICD-10-CM | POA: Diagnosis not present

## 2021-12-31 DIAGNOSIS — D225 Melanocytic nevi of trunk: Secondary | ICD-10-CM | POA: Diagnosis not present

## 2022-04-19 DIAGNOSIS — Z1231 Encounter for screening mammogram for malignant neoplasm of breast: Secondary | ICD-10-CM | POA: Diagnosis not present

## 2022-08-13 DIAGNOSIS — L820 Inflamed seborrheic keratosis: Secondary | ICD-10-CM | POA: Diagnosis not present

## 2022-08-19 DIAGNOSIS — I10 Essential (primary) hypertension: Secondary | ICD-10-CM | POA: Diagnosis not present

## 2022-08-19 DIAGNOSIS — F329 Major depressive disorder, single episode, unspecified: Secondary | ICD-10-CM | POA: Diagnosis not present

## 2022-08-19 DIAGNOSIS — Z6836 Body mass index (BMI) 36.0-36.9, adult: Secondary | ICD-10-CM | POA: Diagnosis not present

## 2022-08-19 DIAGNOSIS — E6609 Other obesity due to excess calories: Secondary | ICD-10-CM | POA: Diagnosis not present

## 2022-08-19 DIAGNOSIS — Z1331 Encounter for screening for depression: Secondary | ICD-10-CM | POA: Diagnosis not present

## 2022-08-19 DIAGNOSIS — I4510 Unspecified right bundle-branch block: Secondary | ICD-10-CM | POA: Diagnosis not present

## 2022-08-19 DIAGNOSIS — R7309 Other abnormal glucose: Secondary | ICD-10-CM | POA: Diagnosis not present

## 2022-08-19 DIAGNOSIS — Z0001 Encounter for general adult medical examination with abnormal findings: Secondary | ICD-10-CM | POA: Diagnosis not present

## 2022-11-28 DIAGNOSIS — H5203 Hypermetropia, bilateral: Secondary | ICD-10-CM | POA: Diagnosis not present

## 2022-11-28 DIAGNOSIS — H524 Presbyopia: Secondary | ICD-10-CM | POA: Diagnosis not present

## 2022-11-28 DIAGNOSIS — H52223 Regular astigmatism, bilateral: Secondary | ICD-10-CM | POA: Diagnosis not present

## 2022-12-05 DIAGNOSIS — M79674 Pain in right toe(s): Secondary | ICD-10-CM | POA: Diagnosis not present

## 2022-12-05 DIAGNOSIS — L03031 Cellulitis of right toe: Secondary | ICD-10-CM | POA: Diagnosis not present

## 2022-12-05 DIAGNOSIS — M79671 Pain in right foot: Secondary | ICD-10-CM | POA: Diagnosis not present

## 2022-12-05 DIAGNOSIS — L600 Ingrowing nail: Secondary | ICD-10-CM | POA: Diagnosis not present

## 2022-12-19 DIAGNOSIS — L600 Ingrowing nail: Secondary | ICD-10-CM | POA: Diagnosis not present

## 2022-12-19 DIAGNOSIS — M79671 Pain in right foot: Secondary | ICD-10-CM | POA: Diagnosis not present

## 2022-12-19 DIAGNOSIS — M79674 Pain in right toe(s): Secondary | ICD-10-CM | POA: Diagnosis not present

## 2022-12-19 DIAGNOSIS — L03031 Cellulitis of right toe: Secondary | ICD-10-CM | POA: Diagnosis not present

## 2023-01-14 DIAGNOSIS — L578 Other skin changes due to chronic exposure to nonionizing radiation: Secondary | ICD-10-CM | POA: Diagnosis not present

## 2023-01-14 DIAGNOSIS — L814 Other melanin hyperpigmentation: Secondary | ICD-10-CM | POA: Diagnosis not present

## 2023-01-14 DIAGNOSIS — L821 Other seborrheic keratosis: Secondary | ICD-10-CM | POA: Diagnosis not present

## 2023-01-14 DIAGNOSIS — L570 Actinic keratosis: Secondary | ICD-10-CM | POA: Diagnosis not present

## 2023-01-14 DIAGNOSIS — D2271 Melanocytic nevi of right lower limb, including hip: Secondary | ICD-10-CM | POA: Diagnosis not present

## 2023-01-14 DIAGNOSIS — L723 Sebaceous cyst: Secondary | ICD-10-CM | POA: Diagnosis not present

## 2023-01-14 DIAGNOSIS — D225 Melanocytic nevi of trunk: Secondary | ICD-10-CM | POA: Diagnosis not present

## 2023-04-07 DIAGNOSIS — Z20828 Contact with and (suspected) exposure to other viral communicable diseases: Secondary | ICD-10-CM | POA: Diagnosis not present

## 2023-04-07 DIAGNOSIS — J0100 Acute maxillary sinusitis, unspecified: Secondary | ICD-10-CM | POA: Diagnosis not present

## 2023-04-07 DIAGNOSIS — I10 Essential (primary) hypertension: Secondary | ICD-10-CM | POA: Diagnosis not present

## 2023-04-07 DIAGNOSIS — Z6836 Body mass index (BMI) 36.0-36.9, adult: Secondary | ICD-10-CM | POA: Diagnosis not present

## 2023-04-07 DIAGNOSIS — E6609 Other obesity due to excess calories: Secondary | ICD-10-CM | POA: Diagnosis not present

## 2023-04-21 DIAGNOSIS — Z1231 Encounter for screening mammogram for malignant neoplasm of breast: Secondary | ICD-10-CM | POA: Diagnosis not present

## 2023-06-11 DIAGNOSIS — M810 Age-related osteoporosis without current pathological fracture: Secondary | ICD-10-CM | POA: Diagnosis not present

## 2023-06-11 DIAGNOSIS — Z13820 Encounter for screening for osteoporosis: Secondary | ICD-10-CM | POA: Diagnosis not present

## 2023-06-11 DIAGNOSIS — Z780 Asymptomatic menopausal state: Secondary | ICD-10-CM | POA: Diagnosis not present

## 2023-06-11 DIAGNOSIS — M8588 Other specified disorders of bone density and structure, other site: Secondary | ICD-10-CM | POA: Diagnosis not present

## 2023-07-10 DIAGNOSIS — L723 Sebaceous cyst: Secondary | ICD-10-CM | POA: Diagnosis not present

## 2023-07-20 IMAGING — DX DG WRIST COMPLETE 3+V*L*
3 series · 3 of 3 positions shown · non-contrast
Comparison: None.

CLINICAL DATA: Fall.

EXAM:
LEFT WRIST - COMPLETE 3+ VIEW

[wrist ap]
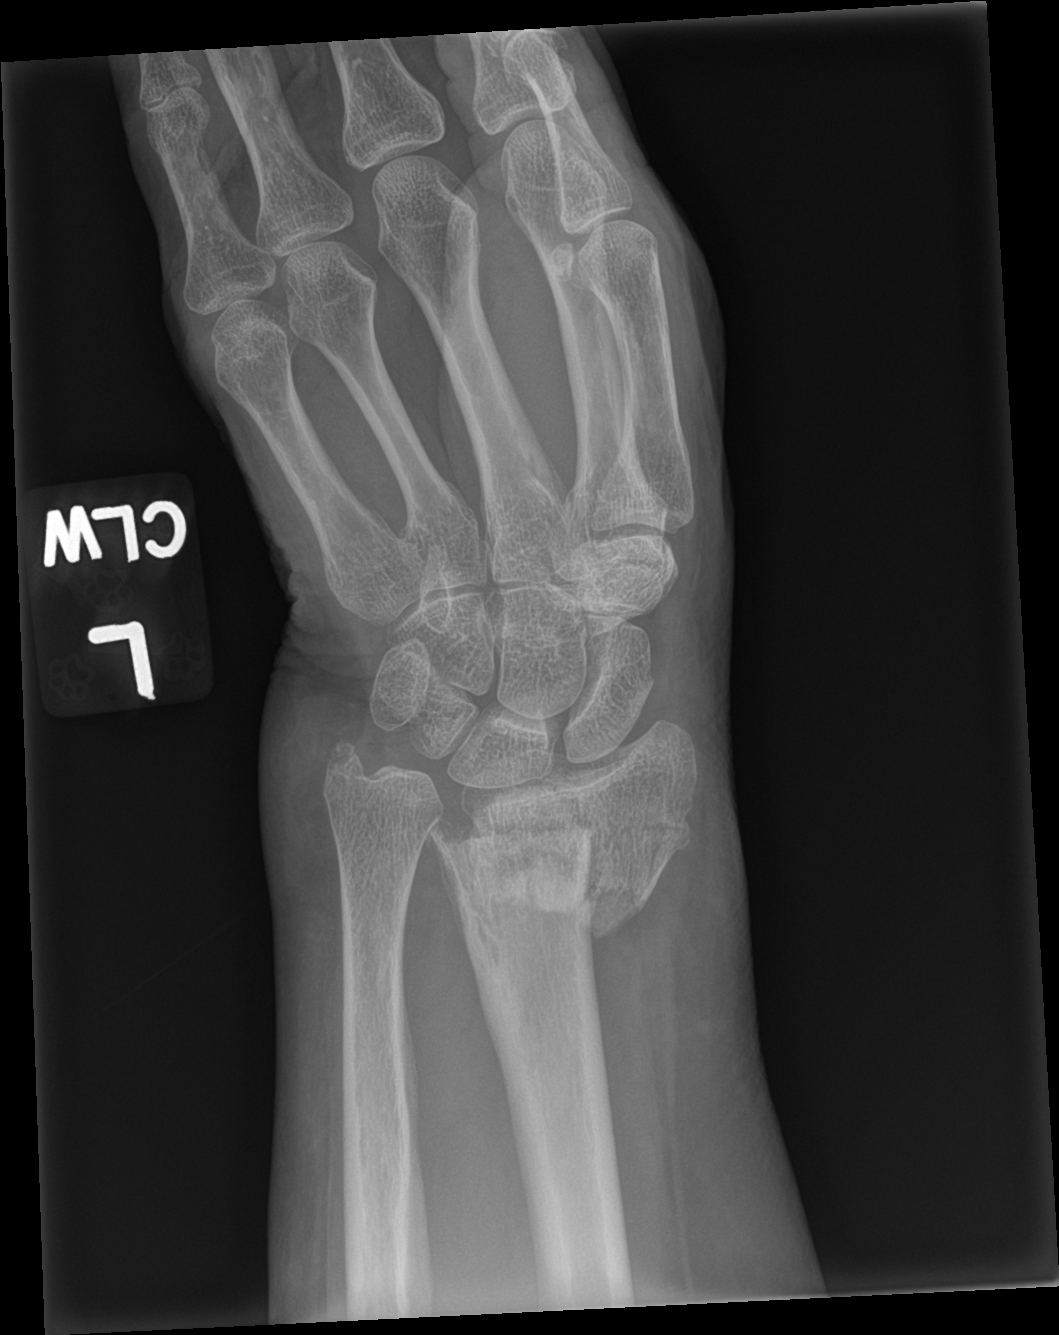

[wrist obl]
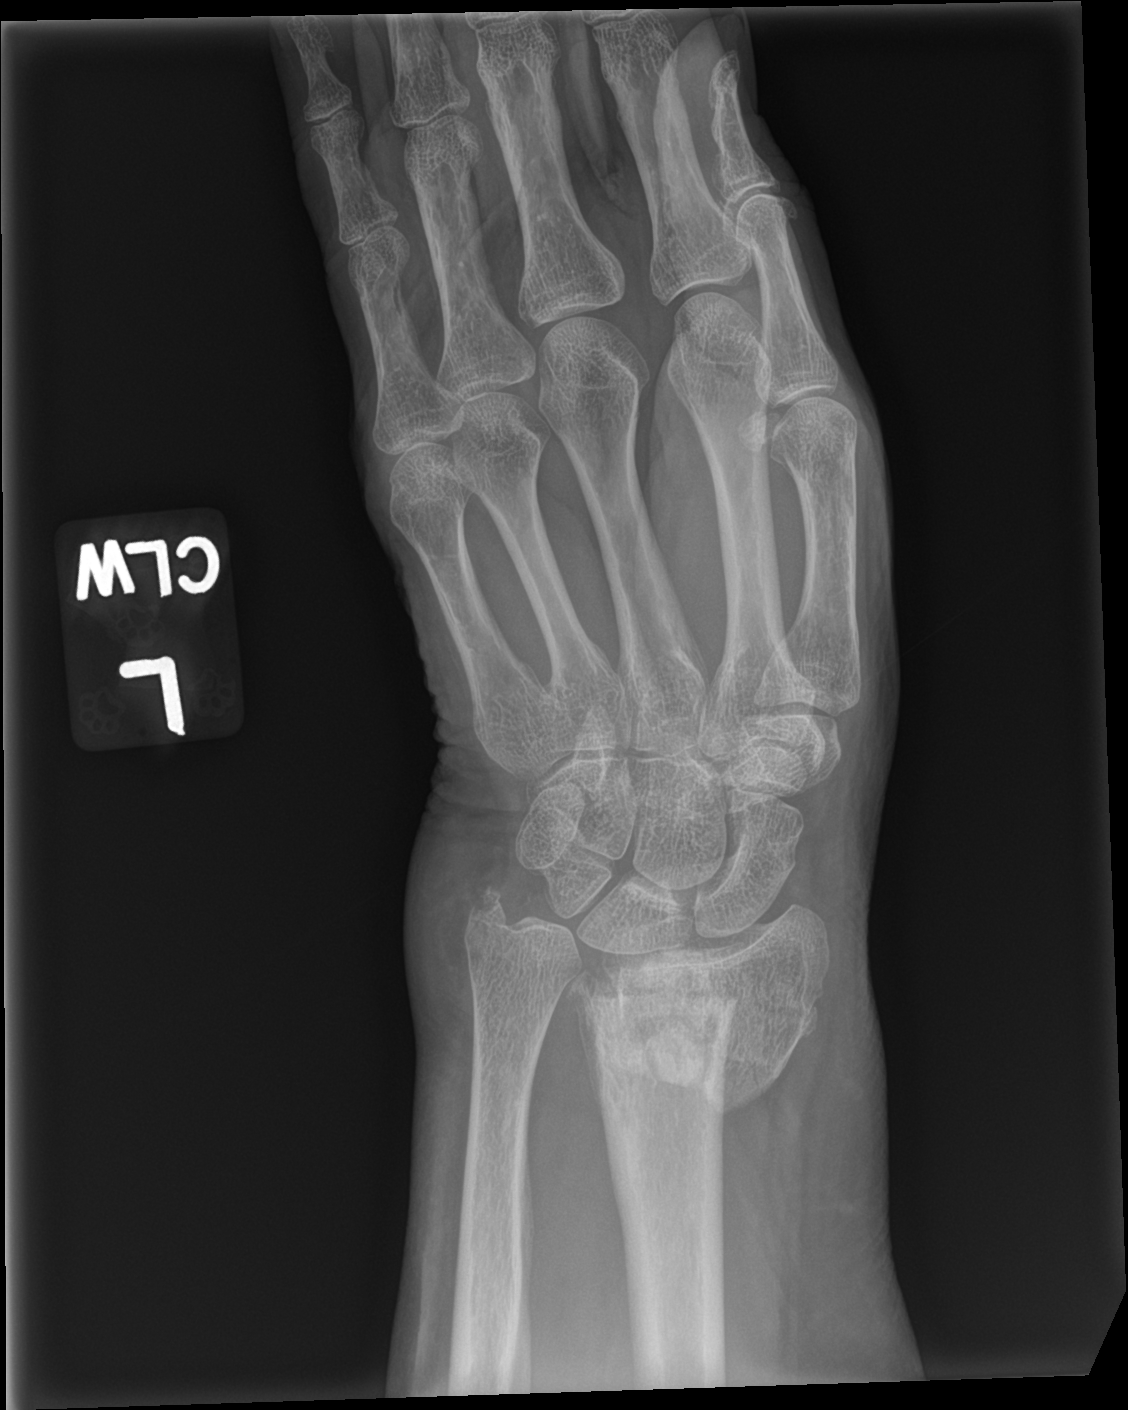

[wrist lat]
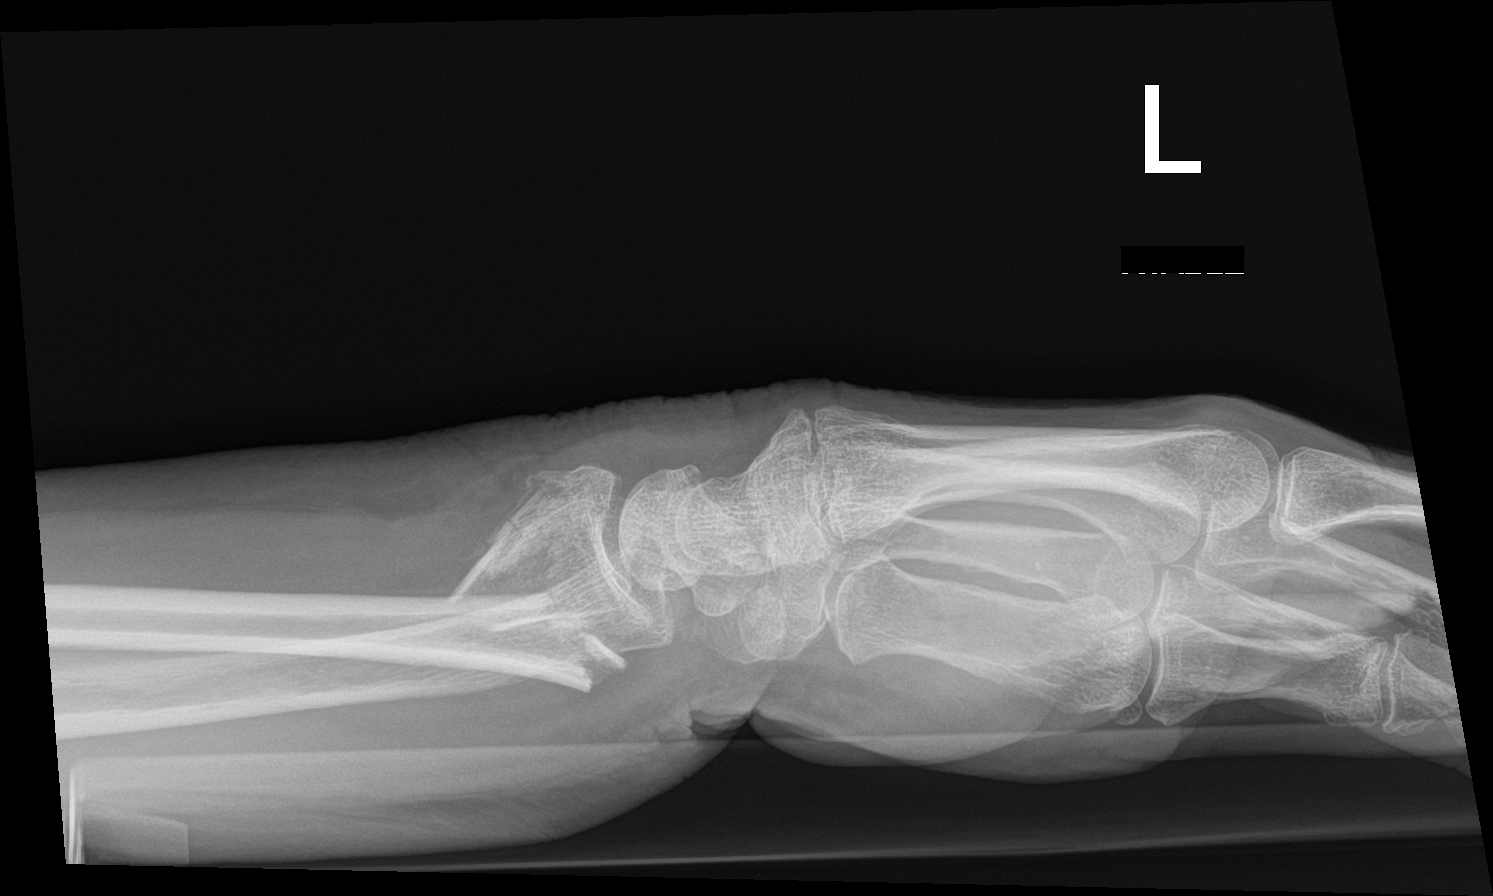

[3 of 3 positions shown; findings below may reference images not displayed]

FINDINGS: Non comminuted, displaced LEFT distal radius fracture with dorsal
angulation of the distal fracture fragment. Question fracture line
extension to the articular surface. See key image. Intact
radiocarpal alignment

Minimally comminuted, nondisplaced LEFT ulnar styloid process
fracture.
IMPRESSION: 1. LEFT distal radius fracture dislocation, as above, with suspected
intra-articular extension
2. Nondisplaced LEFT ulnar styloid process fracture.

## 2023-07-20 IMAGING — DX DG WRIST COMPLETE 3+V*L*
3 series · 3 of 3 positions shown · non-contrast
Comparison: 9424 hours today.

CLINICAL DATA: 66-year-old female with distal left radius and ulna
fracture status post attempted reduction.

EXAM:
LEFT WRIST - COMPLETE 3+ VIEW

[wrist ap]
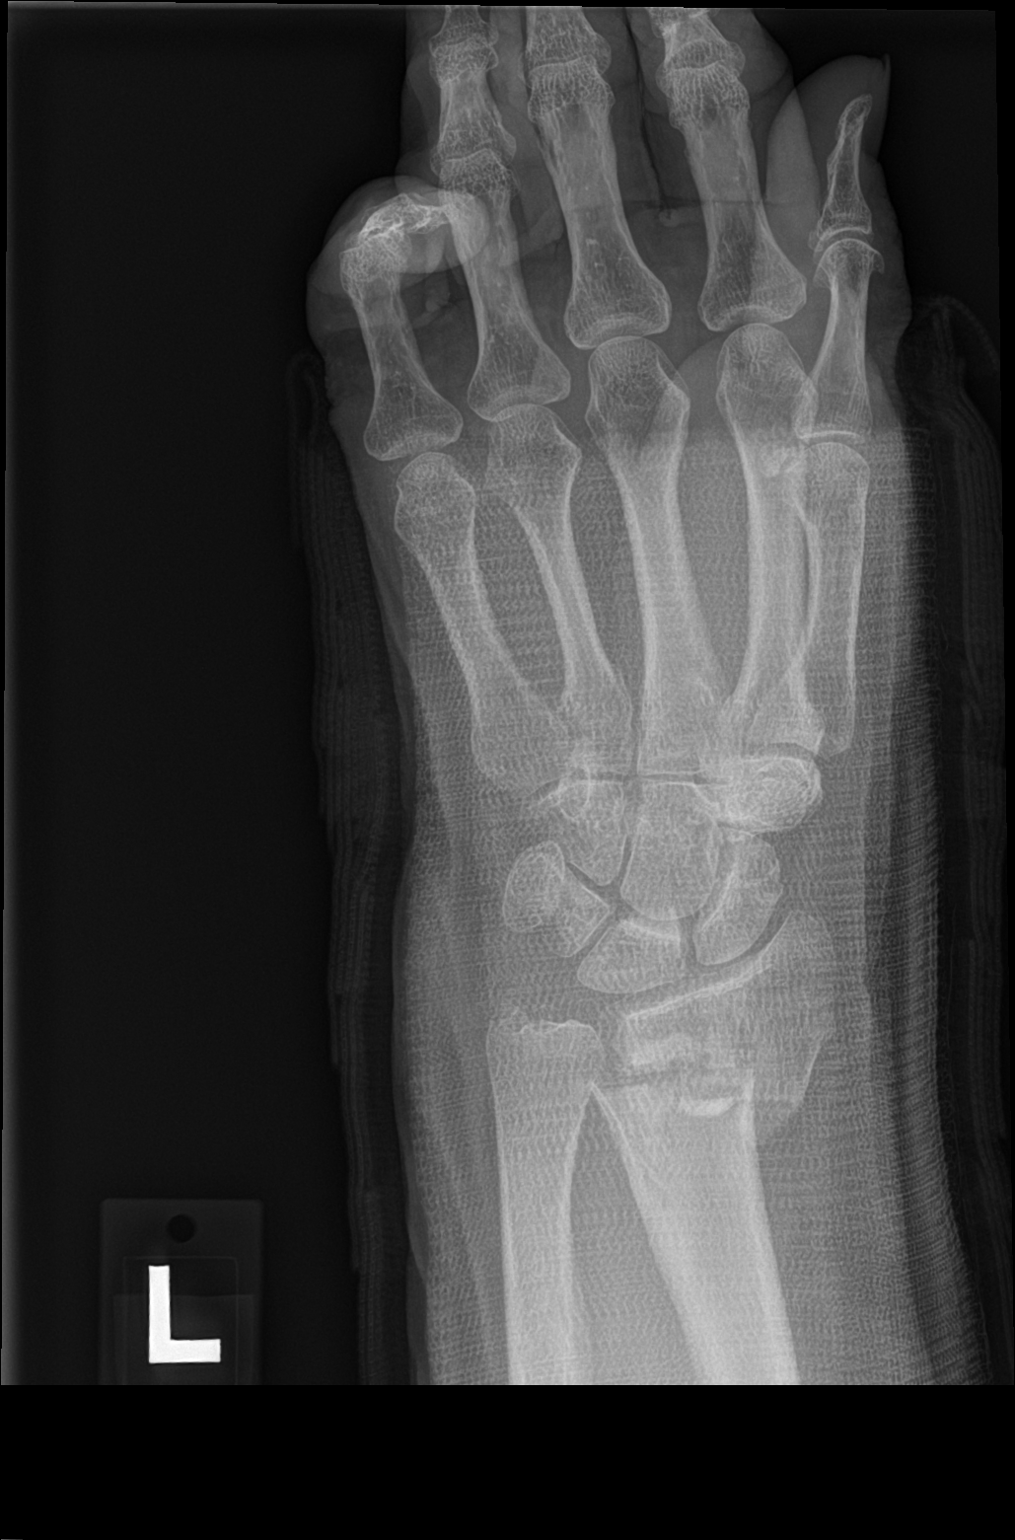

[wrist obl]
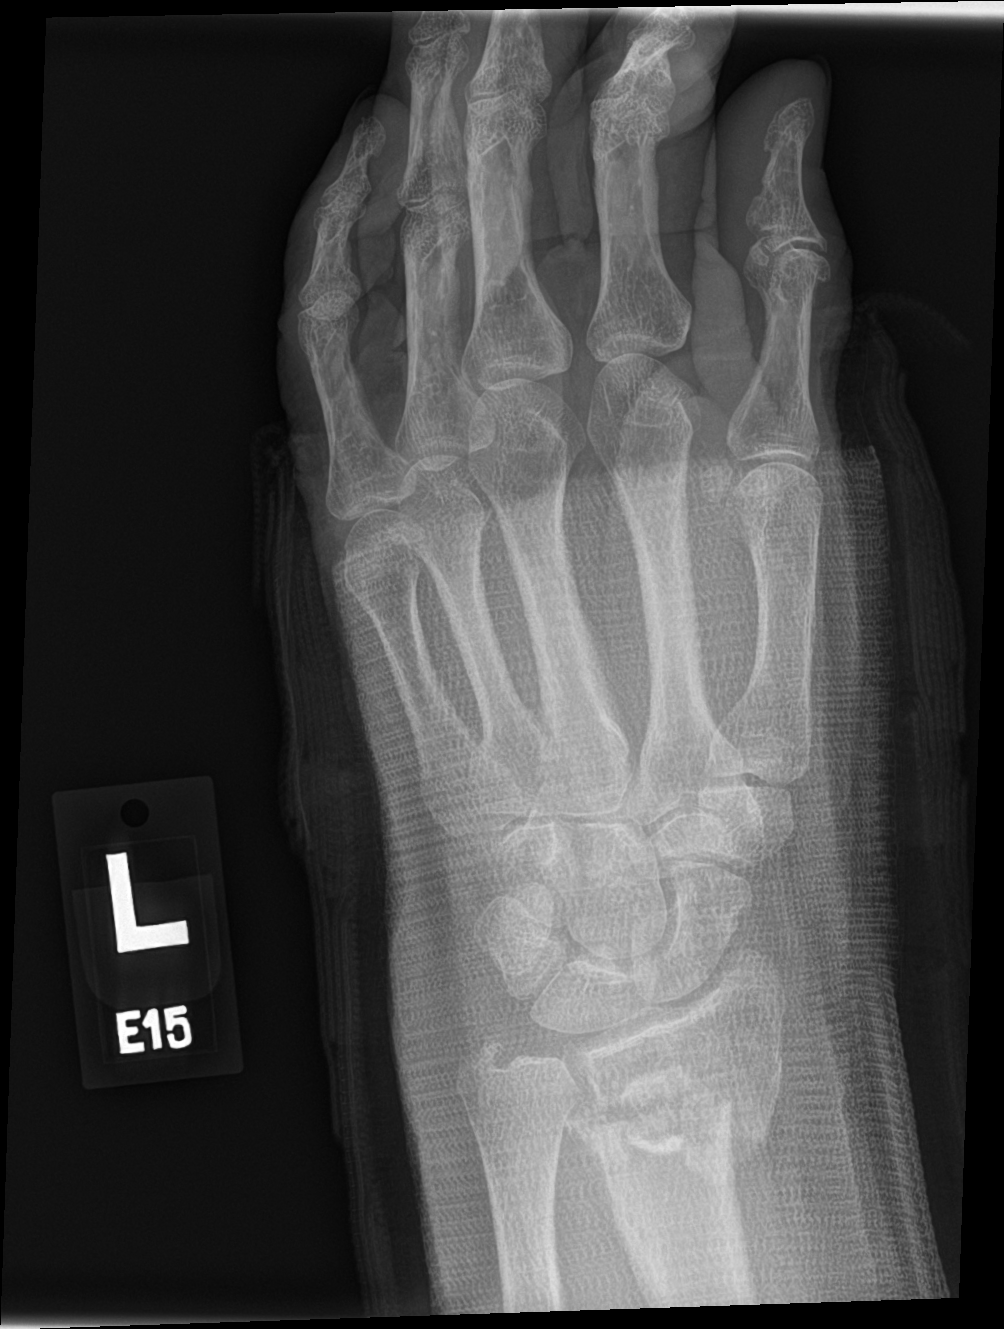

[wrist lat]
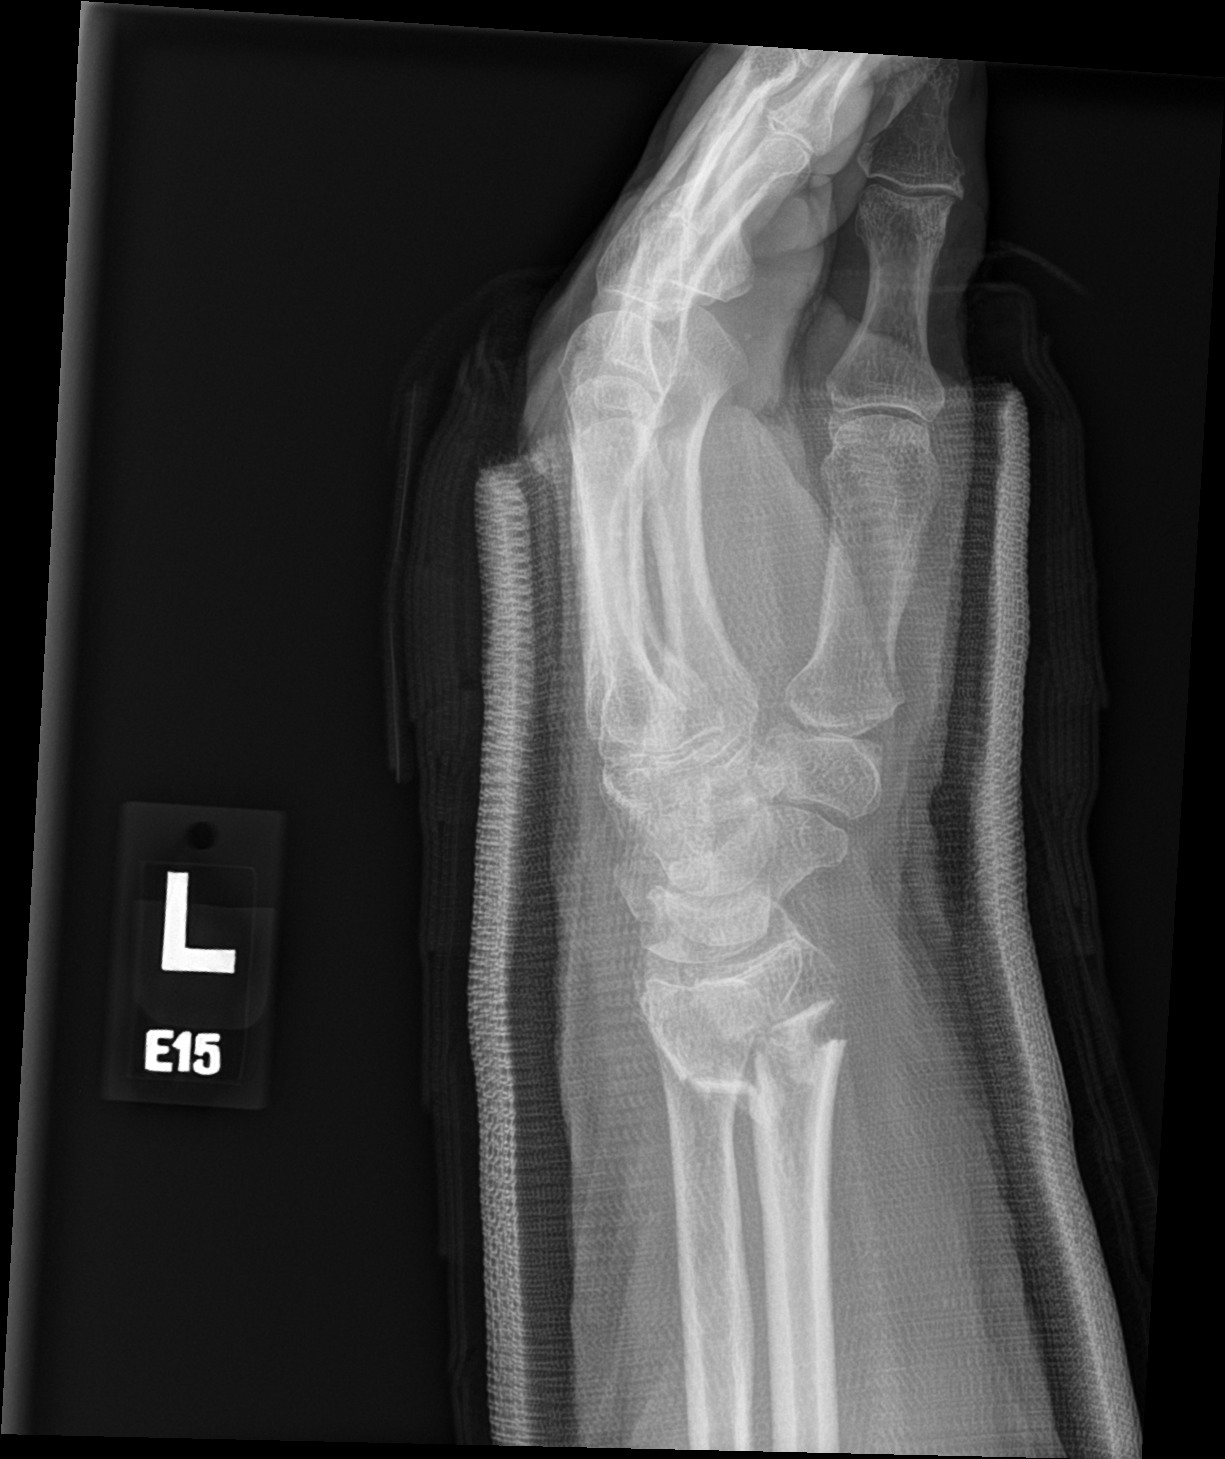

[3 of 3 positions shown; findings below may reference images not displayed]

FINDINGS: Splint or cast material now in place. Mildly improved alignment of
the comminuted distal left radius fragments. Residual less than [DATE]
shaft width radial displacement, and moderate dorsal impaction.
Ulnar styloid fracture less apparent due to splint or cast artifact
now. Carpal bones appear aligned and intact. No new osseous
abnormality identified.
IMPRESSION: 1. Comminuted distal left radius fracture with residual lateral
displacement and moderate dorsal impaction following splint/cast.
2. Ulnar styloid fracture.

## 2023-08-14 DIAGNOSIS — Z124 Encounter for screening for malignant neoplasm of cervix: Secondary | ICD-10-CM | POA: Diagnosis not present

## 2023-08-14 DIAGNOSIS — Z6833 Body mass index (BMI) 33.0-33.9, adult: Secondary | ICD-10-CM | POA: Diagnosis not present

## 2023-08-14 DIAGNOSIS — Z1331 Encounter for screening for depression: Secondary | ICD-10-CM | POA: Diagnosis not present

## 2023-12-02 DIAGNOSIS — Z23 Encounter for immunization: Secondary | ICD-10-CM | POA: Diagnosis not present

## 2024-01-13 DIAGNOSIS — D225 Melanocytic nevi of trunk: Secondary | ICD-10-CM | POA: Diagnosis not present

## 2024-01-13 DIAGNOSIS — L578 Other skin changes due to chronic exposure to nonionizing radiation: Secondary | ICD-10-CM | POA: Diagnosis not present

## 2024-01-13 DIAGNOSIS — L814 Other melanin hyperpigmentation: Secondary | ICD-10-CM | POA: Diagnosis not present

## 2024-01-13 DIAGNOSIS — D2271 Melanocytic nevi of right lower limb, including hip: Secondary | ICD-10-CM | POA: Diagnosis not present

## 2024-01-13 DIAGNOSIS — L821 Other seborrheic keratosis: Secondary | ICD-10-CM | POA: Diagnosis not present

## 2024-01-13 DIAGNOSIS — L818 Other specified disorders of pigmentation: Secondary | ICD-10-CM | POA: Diagnosis not present

## 2024-03-04 ENCOUNTER — Ambulatory Visit: Admitting: Adult Health

## 2024-03-04 ENCOUNTER — Other Ambulatory Visit (HOSPITAL_COMMUNITY)
Admission: RE | Admit: 2024-03-04 | Discharge: 2024-03-04 | Disposition: A | Source: Ambulatory Visit | Attending: Obstetrics & Gynecology | Admitting: Obstetrics & Gynecology

## 2024-03-04 ENCOUNTER — Encounter: Payer: Self-pay | Admitting: Adult Health

## 2024-03-04 VITALS — BP 149/80 | HR 66 | Ht 63.0 in | Wt 207.5 lb

## 2024-03-04 DIAGNOSIS — Z01419 Encounter for gynecological examination (general) (routine) without abnormal findings: Secondary | ICD-10-CM | POA: Diagnosis present

## 2024-03-04 DIAGNOSIS — H8102 Meniere's disease, left ear: Secondary | ICD-10-CM | POA: Diagnosis not present

## 2024-03-04 DIAGNOSIS — R030 Elevated blood-pressure reading, without diagnosis of hypertension: Secondary | ICD-10-CM | POA: Diagnosis not present

## 2024-03-04 DIAGNOSIS — Z1151 Encounter for screening for human papillomavirus (HPV): Secondary | ICD-10-CM

## 2024-03-04 NOTE — Progress Notes (Signed)
 Patient ID: Virginia Jones, female   DOB: Jun 30, 1955, 69 y.o.   MRN: [id number] History of Present Illness:  Beautiful is a 69 year old white female, married, PM in for a well woman gyn exam and pap. She had one at PCP , but no cells on pap. She wanted one good pap before stops getting, have been normal for years, did have one abnormal in the 90's and none since.  PCP is [facility]   Current Medications, Allergies, Past Medical History, Past Surgical History, Family History and Social History were reviewed in [employer] record.     Review of Systems:  Patient denies any headaches,fatigue, blurred vision, shortness of breath, chest pain, abdominal pain, problems with bowel movements, urination, or intercourse(not active in years). No joint pain or mood swings.  Has hearing loss left ear, has shunt for Meneire;s Disease.she is having pain in low back and has appt with MD in [place] today Denies any vaginal bleeding   Physical Exam:BP (!) 149/80 (BP Location: Left Arm, Patient Position: Sitting, Cuff Size: Large)   Pulse 66   Ht 5' 3 (1.6 m)   Wt 207 lb 8 oz (94.1 kg)   BMI 36.76 kg/m   General:  Well developed, well nourished, no acute distress Skin:  Warm and dry Neck:  Midline trachea, normal thyroid , good ROM, no lymphadenopathy, no carotid bruits heard Lungs; Clear to auscultation bilaterally Breast:  No dominant palpable mass, retraction, or nipple discharge Cardiovascular: Regular rate and rhythm Abdomen:  Soft, non tender, no hepatosplenomegaly Pelvic:  External genitalia is normal in appearance, no lesions.  The vagina is pale and atrophic. Urethra has no lesions or masses. The cervix is smooth with stenotic os, pap with HR HPV genotyping performed.  Uterus is felt to be normal size, shape, and contour.  No adnexal masses or tenderness noted.Bladder is non tender, no masses felt. Rectal:Deferred Extremities/musculoskeletal:  No swelling or varicosities noted,  no clubbing or cyanosis Psych:  No mood changes, alert and cooperative,seems happy AA is 5 Fall risk is low    03/04/2024    9:59 AM  Depression screen PHQ 2/9  Decreased Interest 0  Down, Depressed, Hopeless 0  PHQ - 2 Score 0  Altered sleeping 0  Tired, decreased energy 0  Change in appetite 0  Feeling bad or failure about yourself  0  Trouble concentrating 0  Moving slowly or fidgety/restless 0  Suicidal thoughts 0  PHQ-9 Score 0       03/04/2024   10:00 AM  GAD 7 : Generalized Anxiety Score  Nervous, Anxious, on Edge 0  Control/stop worrying 0  Worry too much - different things 0  Trouble relaxing 0  Restless 0  Easily annoyed or irritable 0  Afraid - awful might happen 0  Total GAD 7 Score 0      Upstream - 03/04/24 0956       Pregnancy Intention Screening   Does the patient want to become pregnant in the next year? N/A    Does the patient's partner want to become pregnant in the next year? N/A    Would the patient like to discuss contraceptive options today? N/A      Contraception Wrap Up   Current Method Post-Menopause    End Method Post-Menopause    Contraception Counseling Provided No         Examination chaperoned by Clarita Salt LPN   Impression and plan: 1. Encounter for gynecological examination with Papanicolaou  smear of cervix (Primary) Pap sent No more paps if normal Labs with PCP Mammogram was negative February 2025 at [facility] Colonoscopy per GI at [facility]   - Cytology - PAP( [facility] [facility])  2. Elevated BP without diagnosis of hypertension Keep check on BP   3. Meniere's disease of left ear

## 2024-03-08 ENCOUNTER — Ambulatory Visit: Payer: Self-pay | Admitting: Adult Health

## 2024-03-08 LAB — CYTOLOGY - PAP
Comment: NEGATIVE
Diagnosis: NEGATIVE
High risk HPV: NEGATIVE

## 2024-03-08 NOTE — Telephone Encounter (Signed)
 Pt aware pap was negative for HPV and malignancy. No more paps needed. Pt voiced understanding. JSY

## 2024-03-08 NOTE — Telephone Encounter (Signed)
-----   Message from Delon Lewis, NP sent at 03/08/2024  1:31 PM EST ----- Let Nathanel know about pap. THX
